# Patient Record
Sex: Male | Born: 1995 | Race: White | Hispanic: Yes | Marital: Single | Smoking: Never smoker
Health system: Southern US, Community
[De-identification: ages and names within clinical notes are randomized; demographics above are authoritative.]

## PROBLEM LIST (undated history)

## (undated) DIAGNOSIS — R7303 Prediabetes: Secondary | ICD-10-CM

## (undated) DIAGNOSIS — S82209A Unspecified fracture of shaft of unspecified tibia, initial encounter for closed fracture: Secondary | ICD-10-CM

## (undated) DIAGNOSIS — E669 Obesity, unspecified: Secondary | ICD-10-CM

## (undated) DIAGNOSIS — Z8719 Personal history of other diseases of the digestive system: Secondary | ICD-10-CM

## (undated) DIAGNOSIS — N62 Hypertrophy of breast: Secondary | ICD-10-CM

## (undated) DIAGNOSIS — L309 Dermatitis, unspecified: Secondary | ICD-10-CM

## (undated) DIAGNOSIS — T4145XA Adverse effect of unspecified anesthetic, initial encounter: Secondary | ICD-10-CM

## (undated) DIAGNOSIS — S82409A Unspecified fracture of shaft of unspecified fibula, initial encounter for closed fracture: Secondary | ICD-10-CM

## (undated) DIAGNOSIS — N442 Benign cyst of testis: Secondary | ICD-10-CM

## (undated) DIAGNOSIS — E049 Nontoxic goiter, unspecified: Secondary | ICD-10-CM

## (undated) DIAGNOSIS — E063 Autoimmune thyroiditis: Secondary | ICD-10-CM

## (undated) DIAGNOSIS — T8859XA Other complications of anesthesia, initial encounter: Secondary | ICD-10-CM

## (undated) DIAGNOSIS — I10 Essential (primary) hypertension: Secondary | ICD-10-CM

## (undated) DIAGNOSIS — K219 Gastro-esophageal reflux disease without esophagitis: Secondary | ICD-10-CM

## (undated) DIAGNOSIS — R1013 Epigastric pain: Secondary | ICD-10-CM

## (undated) HISTORY — PX: OTHER SURGICAL HISTORY: SHX169

## (undated) HISTORY — DX: Gastro-esophageal reflux disease without esophagitis: K21.9

## (undated) HISTORY — DX: Epigastric pain: R10.13

## (undated) HISTORY — DX: Hypertrophy of breast: N62

## (undated) HISTORY — DX: Nontoxic goiter, unspecified: E04.9

## (undated) HISTORY — DX: Prediabetes: R73.03

## (undated) HISTORY — DX: Autoimmune thyroiditis: E06.3

## (undated) HISTORY — PX: INGUINAL HERNIA REPAIR: SUR1180

## (undated) HISTORY — DX: Essential (primary) hypertension: I10

## (undated) HISTORY — DX: Obesity, unspecified: E66.9

---
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
## 2016-01-01 ENCOUNTER — Other Ambulatory Visit: Payer: Self-pay | Admitting: Pediatric Endocrinology

## 2016-02-08 ENCOUNTER — Ambulatory Visit (INDEPENDENT_AMBULATORY_CARE_PROVIDER_SITE_OTHER): Payer: Self-pay | Admitting: Pediatric Endocrinology

## 2016-03-31 ENCOUNTER — Ambulatory Visit (INDEPENDENT_AMBULATORY_CARE_PROVIDER_SITE_OTHER): Payer: Medicaid Other | Admitting: Pediatric Endocrinology

## 2016-03-31 ENCOUNTER — Other Ambulatory Visit: Payer: Self-pay | Admitting: Pediatric Endocrinology

## 2016-03-31 DIAGNOSIS — I10 Essential (primary) hypertension: Secondary | ICD-10-CM

## 2016-04-07 ENCOUNTER — Encounter (HOSPITAL_COMMUNITY): Payer: Self-pay | Admitting: Emergency Medicine

## 2016-04-07 ENCOUNTER — Emergency Department (HOSPITAL_COMMUNITY)
Admission: EM | Admit: 2016-04-07 | Discharge: 2016-04-07 | Disposition: A | Discharge status: Home / Self Care | Payer: Medicaid Other | Attending: Dermatology | Admitting: Dermatology

## 2016-04-07 DIAGNOSIS — Z5321 Procedure and treatment not carried out due to patient leaving prior to being seen by health care provider: Secondary | ICD-10-CM | POA: Insufficient documentation

## 2016-04-07 DIAGNOSIS — M79644 Pain in right finger(s): Secondary | ICD-10-CM | POA: Insufficient documentation

## 2016-04-07 NOTE — ED Triage Notes (Signed)
Pt st's 2 days ago he almost fell and put his hand out to catch himself.  Pt c/o pain to right 5th finger.

## 2016-04-07 NOTE — ED Triage Notes (Signed)
Patient states he wants to leave and go to his doctor instead. Pt encouraged to stay as many are not in front of him, but he wants to leave. Ambulatory with no signs of distress.

## 2016-04-08 ENCOUNTER — Encounter (HOSPITAL_COMMUNITY): Payer: Self-pay | Admitting: Emergency Medicine

## 2016-04-08 ENCOUNTER — Emergency Department (HOSPITAL_COMMUNITY): Payer: Medicaid Other

## 2016-04-08 ENCOUNTER — Emergency Department (HOSPITAL_COMMUNITY)
Admission: EM | Admit: 2016-04-08 | Discharge: 2016-04-08 | Disposition: A | Discharge status: Home / Self Care | Payer: Medicaid Other | Attending: Emergency Medicine | Admitting: Emergency Medicine

## 2016-04-08 DIAGNOSIS — E039 Hypothyroidism, unspecified: Secondary | ICD-10-CM | POA: Insufficient documentation

## 2016-04-08 DIAGNOSIS — S6991XA Unspecified injury of right wrist, hand and finger(s), initial encounter: Secondary | ICD-10-CM | POA: Insufficient documentation

## 2016-04-08 DIAGNOSIS — Y999 Unspecified external cause status: Secondary | ICD-10-CM | POA: Insufficient documentation

## 2016-04-08 DIAGNOSIS — W1839XA Other fall on same level, initial encounter: Secondary | ICD-10-CM | POA: Insufficient documentation

## 2016-04-08 DIAGNOSIS — Z79899 Other long term (current) drug therapy: Secondary | ICD-10-CM | POA: Insufficient documentation

## 2016-04-08 DIAGNOSIS — Y9389 Activity, other specified: Secondary | ICD-10-CM | POA: Insufficient documentation

## 2016-04-08 DIAGNOSIS — Y9289 Other specified places as the place of occurrence of the external cause: Secondary | ICD-10-CM | POA: Insufficient documentation

## 2016-04-08 DIAGNOSIS — I10 Essential (primary) hypertension: Secondary | ICD-10-CM | POA: Insufficient documentation

## 2016-04-08 NOTE — ED Provider Notes (Signed)
[facility]-EMERGENCY DEPT Provider Note   CSN: [id number] Arrival date & time: 04/08/16  4098  By signing my name below, I, Edwin Lee, attest that this documentation has been prepared under the direction and in the presence of  Edwin Robinsons, PA-C. Electronically Signed: Doreatha Lee, ED Scribe. 04/08/16. 10:36 AM.    History   Chief Complaint Chief Complaint  Patient presents with  . Hand Pain  . Finger Injury    HPI Edwin Lee is a 21 y.o. male who presents to the Emergency Department complaining of intermittent right 3rd, 4th and 5th finger tingling s/p injury that occurred 3 days ago with associated mild right 5th finger pain, resolved swelling. Pt reports he fell to the right and caught himself with his right hand on a wall. He denies LOC or head injury. He states his tingling concerns him more than any pain. Per pt, he notices his tingling is more prevalent at night. Pt denies taking OTC medications at home to improve symptoms. Pt states his pain is worsened with finger extension and there are no alleviating factors noted. He denies additional injuries.   The history is provided by the patient. No language interpreter was used.  Hand Pain  This is a new problem. The current episode started more than 2 days ago. The problem occurs daily. The problem has been rapidly improving. The symptoms are aggravated by bending. Nothing relieves the symptoms. He has tried nothing for the symptoms. The treatment provided no relief.    Past Medical History:  Diagnosis Date  . Dyspepsia   . GERD (gastroesophageal reflux disease)   . Goiter   . Gynecomastia, male   . Hypertension   . Hypothyroidism, acquired, autoimmune   . Obesity   . Pre-diabetes   . Thyroiditis, autoimmune     Patient Active Problem List   Diagnosis Date Noted  . Left varicocele 07/25/2014  . Eczema 09/13/2013  . Severe obesity (BMI >= 40) (HCC) 06/07/2013  . Hypertriglyceridemia 12/03/2012  . Hypothyroidism,  acquired, autoimmune   . Thyroiditis, autoimmune   . Goiter   . Gynecomastia, male   . Dyspepsia   . GERD (gastroesophageal reflux disease)   . Hypertension   . Pre-diabetes 08/09/2010    Past Surgical History:  Procedure Laterality Date  . INGUINAL HERNIA REPAIR    . left foot         Home Medications    Prior to Admission medications   Medication Sig Start Date End Date Taking? Authorizing Provider  albuterol (PROVENTIL HFA;VENTOLIN HFA) 108 (90 BASE) MCG/ACT inhaler Inhale 2 puffs into the lungs every 6 (six) hours as needed for wheezing or shortness of breath. Patient not taking: Reported on 08/06/2015 07/24/14   Voncille Lo, MD  clindamycin-benzoyl peroxide [facility]) gel Apply topically 2 (two) times daily. Patient not taking: Reported on 08/06/2015 02/20/15   Voncille Lo, MD  levothyroxine (SYNTHROID, LEVOTHROID) 112 MCG tablet TAKE 1 TABLET BY MOUTH DAILY 01/01/16   Dessa Phi, MD  lisinopril-hydrochlorothiazide (PRINZIDE,ZESTORETIC) 20-25 MG tablet TAKE 1 TABLET BY MOUTH DAILY 04/01/16   David Stall, MD  metFORMIN (GLUCOPHAGE) 500 MG tablet TAKE 1 TABLET BY MOUTH TWICE DAILY WITH MEALS 10/08/15   Verneda Skill, FNP  Omega-3 Fatty Acids (FISH OIL) 1000 MG CAPS Take 1 capsule (1,000 mg total) by mouth daily. Patient not taking: Reported on 08/06/2015 12/03/12   Dessa Phi, MD  omeprazole (PRILOSEC) 40 MG capsule TAKE 1 CAPSULE BY MOUTH EVERY DAY 10/08/15   Rayfield Citizen  Coralee North, FNP  triamcinolone ointment (KENALOG) 0.1 % Apply 1 application topically 2 (two) times daily. To rough raised patches of eczema. Patient not taking: Reported on 12/29/2014 09/13/13   Voncille Lo, MD    Family History Family History  Problem Relation Age of Onset  . Hypertension Father   . Thyroid disease Maternal Uncle     one maternal uncle has high thryoid blood tests. The other uncle has low blood tests.  . Hypertension Paternal Uncle   . Diabetes Maternal Grandmother   .  Hypertension Paternal Grandmother   . Obesity Neg Hx   . Kidney disease Neg Hx   . Cancer Neg Hx   . Anemia Neg Hx     Social History Social History  Substance Use Topics  . Smoking status: Never Smoker  . Smokeless tobacco: Never Used  . Alcohol use No     Allergies   Patient has no known allergies.   Review of Systems Review of Systems  Musculoskeletal: Positive for arthralgias (right 5th finger) and joint swelling (right 5th finger).  Neurological:       +tinging right 3rd, 4th, 5th finger     Physical Exam Updated Vital Signs BP 164/86 (BP Location: Right Arm)   Pulse 60   Temp 98 F (36.7 C) (Oral)   Resp 18   SpO2 98%   Physical Exam  Constitutional: He appears well-developed and well-nourished. No distress.  Patient is afebrile, non-toxic appearing, seating comfortably in chair in no acute distress.   HENT:  Head: Normocephalic.  Eyes: Conjunctivae and EOM are normal.  Neck: Normal range of motion.  Cardiovascular: Normal rate, regular rhythm, normal heart sounds and intact distal pulses.   Pulmonary/Chest: Effort normal and breath sounds normal. No respiratory distress. He has no wheezes. He has no rales.  Lungs clear and equal   Abdominal: He exhibits no distension.  Musculoskeletal: Normal range of motion. He exhibits tenderness.  NVI bilaterally to the upper extremities. FROM of all fingers. Strong grip. 5/5 strength to finger spread against resistance. Some tenderness to the metacarpal bone of the 5th digit.   Neurological: He is alert. No sensory deficit. He exhibits normal muscle tone.  Skin: Skin is warm and dry. Capillary refill takes less than 2 seconds. No rash noted. No erythema. No pallor.  Psychiatric: He has a normal mood and affect. His behavior is normal.  Nursing note and vitals reviewed.    ED Treatments / Results   DIAGNOSTIC STUDIES: Oxygen Saturation is 98% on RA, normal by my interpretation.    COORDINATION OF CARE: 10:26 AM  Discussed treatment plan with pt at bedside which includes XR and pt agreed to plan.    Labs (all labs ordered are listed, but only abnormal results are displayed) Labs Reviewed - No data to display  EKG  EKG Interpretation None       Radiology Dg Hand Complete Right  Result Date: 04/08/2016 CLINICAL DATA:  Pt fell against wall 3 days ago and tried to catch self with rt hand now has tingling, numbness rt little finger and 5th metacarpal EXAM: RIGHT HAND - COMPLETE 3+ VIEW COMPARISON:  None. FINDINGS: There is no evidence of fracture or dislocation. There is no evidence of arthropathy or other focal bone abnormality. Soft tissues are unremarkable. IMPRESSION: No acute osseous injury of the right hand. Electronically Signed   By: Elige Ko   On: 04/08/2016 11:11    Procedures Procedures (including critical care time)  Medications  Ordered in ED Medications - No data to display   Initial Impression / Assessment and Plan / ED Course  I have reviewed the triage vital signs and the nursing notes.  Pertinent labs & imaging results that were available during my care of the patient were reviewed by me and considered in my medical decision making (see chart for details).     21 y/o presenting with hand injury after catching himself on a wall with his right hand. He denied any pain and reports numbness especially at night or while taking a shower in the median nerve distribution including his 5th, 4th and half of the 3rd finger. He was not having symptoms at this time and the swelling has resolved. He did report mild tenderness to palpation of the 5th metacarpal.  Patient X-Ray negative for obvious fracture or dislocation.   Pt advised to follow up with hand surgery. Patient offered brace while in ED, but he has a wrist splint at home and wants to use it. I had the nurse ace bandage the wrist for increased stability. Conservative therapy recommended and discussed.  Patient will be  discharged home & is agreeable with above plan. Pt appears safe for discharge.   Discussed strict return precautions. Patient was advised to return to the emergency department if experiencing any new or worsening symptoms. Patient clearly understood instructions and agreed with discharge plan.   Final Clinical Impressions(s) / ED Diagnoses   Final diagnoses:  Hand injury, right, initial encounter    New Prescriptions New Prescriptions   No medications on file    I personally performed the services described in this documentation, which was scribed in my presence. The recorded information has been reviewed and is accurate.    Georgiana ShoreJessica B Garrette Caine, PA-C 04/08/16 1127    Tilden FossaElizabeth Rees, MD 04/08/16 [id number] [id number]

## 2016-04-08 NOTE — ED Notes (Signed)
Pt walked out of ED, told ortho tech he did not want to wait for paperwork

## 2016-04-08 NOTE — ED Notes (Signed)
Bed: WTR5 Expected date:  Expected time:  Means of arrival:  Comments: 

## 2016-04-08 NOTE — ED Notes (Signed)
Bed: WTR7 Expected date:  Expected time:  Means of arrival:  Comments: 

## 2016-04-30 ENCOUNTER — Other Ambulatory Visit: Payer: Self-pay | Admitting: "Endocrinology

## 2016-04-30 DIAGNOSIS — I10 Essential (primary) hypertension: Secondary | ICD-10-CM

## 2016-05-02 ENCOUNTER — Other Ambulatory Visit: Payer: Self-pay | Admitting: Pediatric Endocrinology

## 2016-05-16 ENCOUNTER — Ambulatory Visit (INDEPENDENT_AMBULATORY_CARE_PROVIDER_SITE_OTHER): Payer: Medicaid Other | Admitting: Pediatric Endocrinology

## 2016-05-22 ENCOUNTER — Other Ambulatory Visit: Payer: Self-pay | Admitting: Pediatrics

## 2016-05-23 NOTE — Telephone Encounter (Signed)
Endo

## 2016-06-01 ENCOUNTER — Other Ambulatory Visit: Payer: Self-pay | Admitting: Pediatric Endocrinology

## 2016-06-01 DIAGNOSIS — I10 Essential (primary) hypertension: Secondary | ICD-10-CM

## 2016-07-31 ENCOUNTER — Other Ambulatory Visit: Payer: Self-pay | Admitting: Pediatric Endocrinology

## 2017-08-26 DIAGNOSIS — K4090 Unilateral inguinal hernia, without obstruction or gangrene, not specified as recurrent: Secondary | ICD-10-CM | POA: Diagnosis not present

## 2017-08-26 DIAGNOSIS — I10 Essential (primary) hypertension: Secondary | ICD-10-CM | POA: Insufficient documentation

## 2017-08-26 DIAGNOSIS — R350 Frequency of micturition: Secondary | ICD-10-CM | POA: Insufficient documentation

## 2017-08-26 DIAGNOSIS — N50812 Left testicular pain: Secondary | ICD-10-CM | POA: Diagnosis present

## 2017-08-26 DIAGNOSIS — E039 Hypothyroidism, unspecified: Secondary | ICD-10-CM | POA: Insufficient documentation

## 2017-08-27 ENCOUNTER — Encounter (HOSPITAL_COMMUNITY): Payer: Self-pay | Admitting: *Deleted

## 2017-08-27 ENCOUNTER — Other Ambulatory Visit: Payer: Self-pay

## 2017-08-27 ENCOUNTER — Emergency Department (HOSPITAL_COMMUNITY): Payer: Medicaid Other

## 2017-08-27 ENCOUNTER — Emergency Department (HOSPITAL_COMMUNITY)
Admission: EM | Admit: 2017-08-27 | Discharge: 2017-08-27 | Disposition: A | Discharge status: Home / Self Care | Payer: Medicaid Other | Attending: Emergency Medicine | Admitting: Emergency Medicine

## 2017-08-27 DIAGNOSIS — N442 Benign cyst of testis: Secondary | ICD-10-CM

## 2017-08-27 DIAGNOSIS — I10 Essential (primary) hypertension: Secondary | ICD-10-CM

## 2017-08-27 DIAGNOSIS — K4090 Unilateral inguinal hernia, without obstruction or gangrene, not specified as recurrent: Secondary | ICD-10-CM

## 2017-08-27 DIAGNOSIS — N50812 Left testicular pain: Secondary | ICD-10-CM

## 2017-08-27 HISTORY — DX: Benign cyst of testis: N44.2

## 2017-08-27 LAB — COMPREHENSIVE METABOLIC PANEL
ALT: 30 U/L (ref 0–44)
ANION GAP: 9 (ref 5–15)
AST: 26 U/L (ref 15–41)
Albumin: 3.8 g/dL (ref 3.5–5.0)
Alkaline Phosphatase: 57 U/L (ref 38–126)
BUN: 11 mg/dL (ref 6–20)
CHLORIDE: 103 mmol/L (ref 98–111)
CO2: 26 mmol/L (ref 22–32)
Calcium: 9.3 mg/dL (ref 8.9–10.3)
Creatinine, Ser: 0.9 mg/dL (ref 0.61–1.24)
GFR calc Af Amer: >60 mL/min (ref >60)
GFR calc non Af Amer: >60 mL/min (ref >60)
GLUCOSE: 108 mg/dL — AB (ref 70–99)
POTASSIUM: 4.3 mmol/L (ref 3.5–5.1)
Sodium: 138 mmol/L (ref 135–145)
TOTAL PROTEIN: 7.5 g/dL (ref 6.5–8.1)
Total Bilirubin: 0.4 mg/dL (ref 0.3–1.2)

## 2017-08-27 LAB — CBC WITH DIFFERENTIAL/PLATELET
BASOS ABS: 0 10*3/uL (ref 0.0–0.1)
BASOS PCT: 0 %
EOS ABS: 0.3 10*3/uL (ref 0.0–0.7)
EOS PCT: 3 %
HCT: 47.6 % (ref 39.0–52.0)
Hemoglobin: 16.3 g/dL (ref 13.0–17.0)
LYMPHS ABS: 2.3 10*3/uL (ref 0.7–4.0)
LYMPHS PCT: 24 %
MCH: 32.6 pg (ref 26.0–34.0)
MCHC: 34.2 g/dL (ref 30.0–36.0)
MCV: 95.2 fL (ref 78.0–100.0)
MONO ABS: 0.6 10*3/uL (ref 0.1–1.0)
Monocytes Relative: 6 %
Neutro Abs: 6.3 10*3/uL (ref 1.7–7.7)
Neutrophils Relative %: 67 %
PLATELETS: 254 10*3/uL (ref 150–400)
RBC: 5 MIL/uL (ref 4.22–5.81)
RDW: 12.7 % (ref 11.5–15.5)
WBC: 9.5 10*3/uL (ref 4.0–10.5)

## 2017-08-27 LAB — URINALYSIS, ROUTINE W REFLEX MICROSCOPIC
Bilirubin Urine: NEGATIVE
Glucose, UA: NEGATIVE mg/dL
HGB URINE DIPSTICK: NEGATIVE
Ketones, ur: NEGATIVE mg/dL
LEUKOCYTES UA: NEGATIVE
Nitrite: NEGATIVE
Protein, ur: NEGATIVE mg/dL
SPECIFIC GRAVITY, URINE: 1.026 (ref 1.005–1.030)
pH: 5 (ref 5.0–8.0)

## 2017-08-27 NOTE — ED Notes (Signed)
Bed: WTR8 Expected date:  Expected time:  Means of arrival:  Comments: 

## 2017-08-27 NOTE — ED Provider Notes (Signed)
[facility]-EMERGENCY DEPT Provider Note   CSN: [id number] Arrival date & time: 08/26/17  2332     History   Chief Complaint Chief Complaint  Patient presents with  . Testicle Pain    left    HPI Edwin Lee is a 22 y.o. male with a hx of dyspepsia, GERD, HTN, hypothyroid, prediabetes presents to the Emergency Department complaining of gradual, persistent, progressively worsening, waxing and waning left testicular pain onset 2 days ago. Associated symptoms include LLQ abd pain.  Walking makes his symptoms worse.  Laying makes the symptoms better.  Pt denies fever, chills, headache, neck pain, chest pain, N/V, weakness, dizziness, syncope.  Pt reports several loose stools over the last few days.  He denies dysuria, hematuria, urgency.  Pt does have increased frequency.  Pt denies penile discharge.   Pt reports inguinal hernia repair at 22mo of age without complication. No other abd surgeries.     The history is provided by the patient and medical records. No language interpreter was used.    Past Medical History:  Diagnosis Date  . Dyspepsia   . GERD (gastroesophageal reflux disease)   . Goiter   . Gynecomastia, male   . Hypertension   . Hypothyroidism, acquired, autoimmune   . Obesity   . Pre-diabetes   . Thyroiditis, autoimmune     Patient Active Problem List   Diagnosis Date Noted  . Left varicocele 07/25/2014  . Eczema 09/13/2013  . Severe obesity (BMI >= 40) (HCC) 06/07/2013  . Hypertriglyceridemia 12/03/2012  . Hypothyroidism, acquired, autoimmune   . Thyroiditis, autoimmune   . Goiter   . Gynecomastia, male   . Dyspepsia   . GERD (gastroesophageal reflux disease)   . Hypertension   . Pre-diabetes 08/09/2010    Past Surgical History:  Procedure Laterality Date  . INGUINAL HERNIA REPAIR    . left foot          Home Medications    Prior to Admission medications   Medication Sig Start Date End Date Taking? Authorizing Provider    albuterol (PROVENTIL HFA;VENTOLIN HFA) 108 (90 BASE) MCG/ACT inhaler Inhale 2 puffs into the lungs every 6 (six) hours as needed for wheezing or shortness of breath. Patient not taking: Reported on 08/06/2015 07/24/14   Ettefagh, Aron BabaKate Scott, MD    Family History Family History  Problem Relation Age of Onset  . Hypertension Father   . Thyroid disease Maternal Uncle        one maternal uncle has high thryoid blood tests. The other uncle has low blood tests.  . Hypertension Paternal Uncle   . Diabetes Maternal Grandmother   . Hypertension Paternal Grandmother   . Obesity Neg Hx   . Kidney disease Neg Hx   . Cancer Neg Hx   . Anemia Neg Hx     Social History Social History   Tobacco Use  . Smoking status: Never Smoker  . Smokeless tobacco: Never Used  Substance Use Topics  . Alcohol use: No  . Drug use: No     Allergies   Patient has no known allergies.   Review of Systems Review of Systems  Constitutional: Negative for appetite change, diaphoresis, fatigue, fever and unexpected weight change.  HENT: Negative for mouth sores.   Eyes: Negative for visual disturbance.  Respiratory: Negative for cough, chest tightness, shortness of breath and wheezing.   Cardiovascular: Negative for chest pain.  Gastrointestinal: Negative for abdominal pain, constipation, diarrhea, nausea and vomiting.  Endocrine:  Negative for polydipsia, polyphagia and polyuria.  Genitourinary: Positive for frequency, scrotal swelling and testicular pain. Negative for dysuria, hematuria and urgency.  Musculoskeletal: Negative for back pain and neck stiffness.  Skin: Negative for rash.  Allergic/Immunologic: Negative for immunocompromised state.  Neurological: Negative for syncope, light-headedness and headaches.  Hematological: Does not bruise/bleed easily.  Psychiatric/Behavioral: Negative for sleep disturbance. The patient is not nervous/anxious.      Physical Exam Updated Vital Signs BP (!) 160/79  (BP Location: Right Arm)   Pulse 66   Temp 98 F (36.7 C) (Oral)   Resp 18   Ht 6' (1.829 m)   Wt 113.4 kg (250 lb)   SpO2 97%   BMI 33.91 kg/m   Physical Exam  Constitutional: He appears well-developed and well-nourished. No distress.  Awake, alert, nontoxic appearance  HENT:  Head: Normocephalic and atraumatic.  Mouth/Throat: Oropharynx is clear and moist. No oropharyngeal exudate.  Eyes: Conjunctivae are normal. No scleral icterus.  Neck: Normal range of motion. Neck supple.  Cardiovascular: Normal rate, regular rhythm and intact distal pulses.  Pulmonary/Chest: Effort normal and breath sounds normal. No respiratory distress. He has no wheezes.  Equal chest expansion  Abdominal: Soft. Bowel sounds are normal. He exhibits no mass. There is no tenderness. There is no rebound and no guarding. A hernia is present. Hernia confirmed positive in the left inguinal area. Hernia confirmed negative in the right inguinal area.  Genitourinary: Right testis shows no mass, no swelling and no tenderness. Right testis is descended. Left testis shows tenderness. Left testis shows no mass and no swelling. Left testis is descended. Uncircumcised. No phimosis, paraphimosis, hypospadias, penile erythema or penile tenderness. No discharge found.     Musculoskeletal: Normal range of motion. He exhibits no edema.  Lymphadenopathy: No inguinal adenopathy noted on the right or left side.  Neurological: He is alert.  Speech is clear and goal oriented Moves extremities without ataxia  Skin: Skin is warm and dry. He is not diaphoretic.  Psychiatric: He has a normal mood and affect.  Nursing note and vitals reviewed.    ED Treatments / Results  Labs (all labs ordered are listed, but only abnormal results are displayed) Labs Reviewed  COMPREHENSIVE METABOLIC PANEL - Abnormal; Notable for the following components:      Result Value   Glucose, Bld 108 (*)    All other components within normal limits    CBC WITH DIFFERENTIAL/PLATELET  URINALYSIS, ROUTINE W REFLEX MICROSCOPIC     Radiology US Scrotum W/doppler  Result Date: 08/27/2017 CLINICAL DATA:  Left testicular pain for 3 days. EXAM: SCROTAL ULTRASOUND DOPPLER ULTRASOUND OF THE TESTICLES TECHNIQUE: Complete ultrasound examination of the testicles, epididymis, and other scrotal structures was performed. Color and spectral Doppler ultrasound were also utilized to evaluate blood flow to the testicles. COMPARISON:  None. FINDINGS: Right testicle Measurements: 3.8 x 2.2 x 3.2 cm. No mass or microlithiasis visualized. There is a small simple appearing cyst measuring about 6 mm maximal diameter either in the periphery or adjacent to the superior right testis. Left testicle Measurements: 3.7 x 2.6 x 3.2 cm. No mass or microlithiasis visualized. Right epididymis:  Normal in size and appearance. Left epididymis:  Normal in size and appearance. Hydrocele:  None visualized. Varicocele:  None visualized. Pulsed Doppler interrogation of both testes demonstrates normal low resistance arterial and venous waveforms bilaterally. IMPRESSION: 6 mm peritesticular cyst on the right is likely benign. Left testis is normal. No evidence of solid mass, torsion, or inflammatory  change. Electronically Signed   By: Burman Nieves M.D.   On: 08/27/2017 03:10    Procedures Procedures (including critical care time)  Medications Ordered in ED Medications - No data to display   Initial Impression / Assessment and Plan / ED Course  I have reviewed the triage vital signs and the nursing notes.  Pertinent labs & imaging results that were available during my care of the patient were reviewed by me and considered in my medical decision making (see chart for details).  Clinical Course as of Aug 28 622  Wynelle Link Aug 27, 2017  0403 Patient hypertensive.  He reports a history of same but is not taking any medications.  BP(!): 160/79 [HM]  0403 No leukocytosis.  WBC: 9.5 [HM]     Clinical Course User Index [HM] Murline Weigel, Dahlia Client, PA-C    Pt presents with left testicular pain.  Patient denies dysuria, hematuria or penile discharge.  He denies risk factors for STDs.  Patient is without leukocytosis.  Urinalysis without evidence of urinary tract infection.  Ultrasound shows no evidence of testicular torsion.  On exam, patient has reducible indirect inguinal hernia.  His exam is difficult however with increased intra-abdominal pressure the hernia is palpable but easily reduces.  Patient is pain-free on my exam.  No evidence of overlying cellulitis.  Strict return precautions given.  Patient will need close surgery follow-up.  Patient noted to be hypertensive in the emergency department.  No signs of hypertensive urgency.  Discussed with patient the need for close follow-up and management by their primary care physician.   The patient was discussed with and seen by Dr. Adela Lank who agrees with the treatment plan.    Final Clinical Impressions(s) / ED Diagnoses   Final diagnoses:  Left testicular pain  Unilateral inguinal hernia without obstruction or gangrene, recurrence not specified  Essential hypertension    ED Discharge Orders    None       Kerith Sherley, Boyd Kerbs 08/27/17 0624    Melene Plan, DO 08/27/17 [phone]

## 2017-08-27 NOTE — Discharge Instructions (Addendum)
1. Medications: usual home medications 2. Treatment: rest, drink plenty of fluids,  3. Follow Up: Please followup with Dr. Daphine DeutscherMartin to discuss repair of your hernia.  Please return to the ER for worsening pain, fever, chills, vomiting or other concerns

## 2017-08-27 NOTE — ED Triage Notes (Signed)
Pt c/o left testicular pain x 2 days.

## 2017-09-05 ENCOUNTER — Ambulatory Visit: Payer: Self-pay | Admitting: Surgery

## 2017-09-05 NOTE — H&P (Signed)
Edwin NeriAbraham Breach Documented: 09/05/2017 11:13 AM Location: [facility] Patient #: [id number] DOB: 18-Feb-1996 Single / Language: [place] / Race: Native [place] or Other [place] Islander Male  History of Present Illness Edwin Lee(Krishauna Schatzman C. Glinda Natzke MD; 09/05/2017 11:40 AM) The patient is a 22 year old male who presents with an inguinal hernia. Note for "Inguinal hernia": ` ` ` Patient sent for surgical consultation at the request of Dr Chapman FitchFloyd, [place] emergency department  Chief Complaint: Left testicular/inguinal pain with inguinal hernia ` ` The patient is a pleasant young man who noticed sharp groin pain and swelling on his left side last month. Felt to not. Concerned him. [facility]. Left angle hernia suspected. Had some testicular discomfort. Ultrasound done otherwise underwhelming. He recalls having both inguinal hernias repaired when he was 916 months of age [facility] [place] in [facility]. No other abdominal surgeries. He is noting some right groin discomfort and some sensitivity at his belly button as well. Usually moves his bowels about twice a day. No urinary issues. He does not smoke. Not a diabetic. Otherwise quite physically active. No history of cardiac issues. Some hypertension.  (Review of systems as stated in this history (HPI) or in the review of systems. Otherwise all other 12 point ROS are negative) ` ` ` CLINICAL DATA: Left testicular pain for 3 days.  EXAM: SCROTAL ULTRASOUND  DOPPLER ULTRASOUND OF THE TESTICLES  TECHNIQUE: Complete ultrasound examination of the testicles, epididymis, and other scrotal structures was performed. Color and spectral Doppler ultrasound were also utilized to evaluate blood flow to the testicles.  COMPARISON: None.  FINDINGS: Right testicle  Measurements: 3.8 x 2.2 x 3.2 cm. No mass or microlithiasis visualized. There is a small simple appearing cyst measuring about 6 mm maximal diameter  either in the periphery or adjacent to the superior right testis.  Left testicle  Measurements: 3.7 x 2.6 x 3.2 cm. No mass or microlithiasis visualized.  Right epididymis: Normal in size and appearance.  Left epididymis: Normal in size and appearance.  Hydrocele: None visualized.  Varicocele: None visualized.  Pulsed Doppler interrogation of both testes demonstrates normal low resistance arterial and venous waveforms bilaterally.  IMPRESSION: 6 mm peritesticular cyst on the right is likely benign. Left testis is normal. No evidence of solid mass, torsion, or inflammatory change.   Electronically Signed By: Burman NievesWilliam Stevens M.D. On: 08/27/2017 03:10   Past Surgical History (Tanisha A. Manson PasseyBrown, RMA; 09/05/2017 11:19 AM) Foot Surgery Left.  Diagnostic Studies History (Tanisha A. Manson PasseyBrown, RMA; 09/05/2017 11:19 AM) Colonoscopy never  Allergies (Tanisha A. Manson PasseyBrown, RMA; 09/05/2017 11:20 AM) No Known Drug Allergies [09/05/2017]: Allergies Reconciled  Medication History (Tanisha A. Manson PasseyBrown, RMA; 09/05/2017 11:20 AM) No Current Medications Medications Reconciled  Social History (Tanisha A. Manson PasseyBrown, RMA; 09/05/2017 11:19 AM) Caffeine use Carbonated beverages. No alcohol use No drug use Tobacco use Former smoker.  Family History (Tanisha A. Manson PasseyBrown, RMA; 09/05/2017 11:19 AM) Diabetes Mellitus Father. Hypertension Father.  Other Problems (Tanisha A. Manson PasseyBrown, RMA; 09/05/2017 11:19 AM) Asthma High blood pressure Inguinal Hernia Thyroid Disease     Review of Systems (Tanisha A. Brown RMA; 09/05/2017 11:19 AM) General Present- Appetite Loss, Chills, Fatigue and Night Sweats. Not Present- Fever, Weight Gain and Weight Loss. Skin Present- Dryness and Rash. Not Present- Change in Wart/Mole, Hives, Jaundice, New Lesions, Non-Healing Wounds and Ulcer. HEENT Present- Nose Bleed, Ringing in the Ears, Seasonal Allergies, Sinus Pain, Sore Throat and Wears glasses/contact  lenses. Not Present- Earache, Hearing Loss, Hoarseness, Oral  Ulcers, Visual Disturbances and Yellow Eyes. Respiratory Present- Difficulty Breathing. Not Present- Bloody sputum, Chronic Cough, Snoring and Wheezing. Breast Not Present- Breast Mass, Breast Pain, Nipple Discharge and Skin Changes. Cardiovascular Present- Chest Pain, Difficulty Breathing Lying Down, Leg Cramps and Shortness of Breath. Not Present- Palpitations, Rapid Heart Rate and Swelling of Extremities. Gastrointestinal Present- Abdominal Pain, Bloating, Chronic diarrhea, Constipation, Gets full quickly at meals and Nausea. Not Present- Bloody Stool, Change in Bowel Habits, Difficulty Swallowing, Excessive gas, Hemorrhoids, Indigestion, Rectal Pain and Vomiting. Male Genitourinary Present- Change in Urinary Stream. Not Present- Blood in Urine, Frequency, Impotence, Nocturia, Painful Urination, Urgency and Urine Leakage. Musculoskeletal Present- Back Pain, Muscle Pain and Muscle Weakness. Not Present- Joint Pain, Joint Stiffness and Swelling of Extremities. Neurological Present- Decreased Memory, Headaches, Numbness, Tingling, Trouble walking and Weakness. Not Present- Fainting, Seizures and Tremor. Psychiatric Not Present- Anxiety, Bipolar, Change in Sleep Pattern, Depression, Fearful and Frequent crying. Endocrine Present- Hot flashes. Not Present- Cold Intolerance, Excessive Hunger, Hair Changes, Heat Intolerance and New Diabetes. Hematology Not Present- Blood Thinners, Easy Bruising, Excessive bleeding, Gland problems, HIV and Persistent Infections.  Vitals (Tanisha A. Brown RMA; 09/05/2017 11:20 AM) 09/05/2017 11:19 AM Weight: 260.8 lb Height: 72in Body Surface Area: 2.38 m Body Mass Index: 35.37 kg/m  Temp.: 97.68F  Pulse: 70 (Regular)  BP: 128/84 (Sitting, Left Arm, Standard)      Physical Exam Edwin Sportsman MD; 09/05/2017 11:41 AM)  General Mental Status-Alert. General Appearance-Not in acute  distress, Not Sickly. Orientation-Oriented X3. Hydration-Well hydrated. Voice-Normal.  Integumentary Global Assessment Upon inspection and palpation of skin surfaces of the - Axillae: non-tender, no inflammation or ulceration, no drainage. and Distribution of scalp and body hair is normal. General Characteristics Temperature - normal warmth is noted.  Head and Neck Head-normocephalic, atraumatic with no lesions or palpable masses. Face Global Assessment - atraumatic, no absence of expression. Neck Global Assessment - no abnormal movements, no bruit auscultated on the right, no bruit auscultated on the left, no decreased range of motion, non-tender. Trachea-midline. Thyroid Gland Characteristics - non-tender.  Eye Eyeball - Left-Extraocular movements intact, No Nystagmus. Eyeball - Right-Extraocular movements intact, No Nystagmus. Cornea - Left-No Hazy. Cornea - Right-No Hazy. Sclera/Conjunctiva - Left-No scleral icterus, No Discharge. Sclera/Conjunctiva - Right-No scleral icterus, No Discharge. Pupil - Left-Direct reaction to light normal. Pupil - Right-Direct reaction to light normal.  ENMT Ears Pinna - Left - no drainage observed, no generalized tenderness observed. Right - no drainage observed, no generalized tenderness observed. Nose and Sinuses External Inspection of the Nose - no destructive lesion observed. Inspection of the nares - Left - quiet respiration. Right - quiet respiration. Mouth and Throat Lips - Upper Lip - no fissures observed, no pallor noted. Lower Lip - no fissures observed, no pallor noted. Nasopharynx - no discharge present. Oral Cavity/Oropharynx - Tongue - no dryness observed. Oral Mucosa - no cyanosis observed. Hypopharynx - no evidence of airway distress observed.  Chest and Lung Exam Inspection Movements - Normal and Symmetrical. Accessory muscles - No use of accessory muscles in breathing. Palpation Palpation of the  chest reveals - Non-tender. Auscultation Breath sounds - Normal and Clear.  Cardiovascular Auscultation Rhythm - Regular. Murmurs & Other Heart Sounds - Auscultation of the heart reveals - No Murmurs and No Systolic Clicks.  Abdomen Inspection Inspection of the abdomen reveals - No Visible peristalsis and No Abnormal pulsations. Umbilicus - No Bleeding, No Urine drainage. Palpation/Percussion Palpation and Percussion of the abdomen reveal - Soft, Non  Tender, No Rebound tenderness, No Rigidity (guarding) and No Cutaneous hyperesthesia. Note: Abdomen soft. Nontender. Not distended. Obese but not distended. No diastases. Small but very sensitive umbilical hernia at the base of the stalk. No guarding.  Male Genitourinary Sexual Maturity Tanner 5 - Adult hair pattern and Adult penile size and shape. Note: Sharp sensitivity at left spermatic cord and external ring with impulse suspicious for inguinal hernia. Sensitivity in right groin as well but impulse not as definite. No testicular masses. Uncircumcised male.  Peripheral Vascular Upper Extremity Inspection - Left - No Cyanotic nailbeds, Not Ischemic. Right - No Cyanotic nailbeds, Not Ischemic.  Neurologic Neurologic evaluation reveals -normal attention span and ability to concentrate, able to name objects and repeat phrases. Appropriate fund of knowledge , normal sensation and normal coordination. Mental Status Affect - not angry, not paranoid. Cranial Nerves-Normal Bilaterally. Gait-Normal.  Neuropsychiatric Mental status exam performed with findings of-able to articulate well with normal speech/language, rate, volume and coherence, thought content normal with ability to perform basic computations and apply abstract reasoning and no evidence of hallucinations, delusions, obsessions or homicidal/suicidal ideation.  Musculoskeletal Global Assessment Spine, Ribs and Pelvis - no instability, subluxation or laxity. Right  Upper Extremity - no instability, subluxation or laxity.  Lymphatic Head & Neck  General Head & Neck Lymphatics: Bilateral - Description - No Localized lymphadenopathy. Axillary  General Axillary Region: Bilateral - Description - No Localized lymphadenopathy. Femoral & Inguinal  Generalized Femoral & Inguinal Lymphatics: Left - Description - No Localized lymphadenopathy. Right - Description - No Localized lymphadenopathy.    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 I think he would benefit from laparoscopic exploration and repair of hernias found. He notes similar pain starting on the right side. Perhaps his developing one on there as well. Examination challenged by his thicken skin and obesity. Testicular ultrasound otherwise underwhelming for any other etiologies.  He is interested in proceeding with surgery as soon as possible   GROIN PAIN, RIGHT (R10.31) Impression: Right groin pain suspicious for inguinal hernia there as well. Not as definite on exam challenged by his body size.   UMBILICAL HERNIA WITHOUT OBSTRUCTION AND WITHOUT GANGRENE (K42.9) Impression: Small but sensitive umbilical hernia. Would primarily repair at the time of surgery unless larger than suspected undue mesh repair. I suspect it is small enough to just do a primary repair.   PREOP - ING HERNIA - ENCOUNTER FOR PREOPERATIVE EXAMINATION FOR GENERAL SURGICAL PROCEDURE (Z01.818)  Current Plans You are being scheduled for surgery- Our schedulers will call you.  You should hear from our office's scheduling department within 5 working days about the location, date, and time of surgery. We try to make accommodations for patient's preferences in scheduling surgery, but sometimes the OR schedule or the surgeon's schedule prevents [place] from making those  accommodations.  If you have not heard from our office [phone]) in 5 working days, call the office and ask for your surgeon's nurse.  If you have other questions about your diagnosis, plan, or surgery, call the office and ask for your surgeon's nurse.  Written instructions provided The anatomy & physiology of the abdominal wall and pelvic floor was discussed. The pathophysiology of hernias in the inguinal and pelvic region was discussed. Natural history risks such as progressive enlargement, pain, incarceration, and strangulation was discussed. Contributors to complications such as smoking, obesity, diabetes, prior surgery,  etc were discussed.  I feel the risks of no intervention will lead to serious problems that outweigh the operative risks; therefore, I recommended surgery to reduce and repair the hernia. I explained laparoscopic techniques with possible need for an open approach. I noted usual use of mesh to patch and/or buttress hernia repair  Risks such as bleeding, infection, abscess, need for further treatment, heart attack, death, and other risks were discussed. I noted a good likelihood this will help address the problem. Goals of post-operative recovery were discussed as well. Possibility that this will not correct all symptoms was explained. I stressed the importance of low-impact activity, aggressive pain control, avoiding constipation, & not pushing through pain to minimize risk of post-operative chronic pain or injury. Possibility of reherniation was discussed. We will work to minimize complications.  An educational handout further explaining the pathology & treatment options was given as well. Questions were answered. The patient expresses understanding & wishes to proceed with surgery.  Pt Education - Pamphlet Given - Laparoscopic Hernia Repair: discussed with patient and provided information. Pt Education - [facility] Pain Control (Kirstin Kugler) Pt Education - [facility] Hernia  Post-Op HCI (Watson Robarge): discussed with patient and provided information. Pt Education - [facility] Mesh education: discussed with patient and provided information.  Edwin Sportsman, MD, FACS, MASCRS Gastrointestinal and Minimally Invasive Surgery    [address], [address] [place], [place] [address] [phone] Main / Paging [phone] Fax

## 2017-09-05 NOTE — H&P (View-Only) (Signed)
Edwin Lee Documented: 09/05/2017 11:13 AM Location: [facility] Patient #: [id number] DOB: 06/06/1995 Single / Language: English / Race: Native Hawaiian or Other Pacific Islander Male  History of Present Illness (Deiontae Rabel C. Cindy Brindisi MD; 09/05/2017 11:40 AM) The patient is a 21 year old male who presents with an inguinal hernia. Note for "Inguinal hernia": ` ` ` Patient sent for surgical consultation at the request of Dr Floyd,  emergency department  Chief Complaint: Left testicular/inguinal pain with inguinal hernia ` ` The patient is a pleasant young man who noticed sharp groin pain and swelling on his left side last month. Felt to not. Concerned him. [facility]. Left angle hernia suspected. Had some testicular discomfort. Ultrasound done otherwise underwhelming. He recalls having both inguinal hernias repaired when he was 6 months of age at [facility] in [place] in 1999. No other abdominal surgeries. He is noting some right groin discomfort and some sensitivity at his belly button as well. Usually moves his bowels about twice a day. No urinary issues. He does not smoke. Not a diabetic. Otherwise quite physically active. No history of cardiac issues. Some hypertension.  (Review of systems as stated in this history (HPI) or in the review of systems. Otherwise all other 12 point ROS are negative) ` ` ` CLINICAL DATA: Left testicular pain for 3 days.  EXAM: SCROTAL ULTRASOUND  DOPPLER ULTRASOUND OF THE TESTICLES  TECHNIQUE: Complete ultrasound examination of the testicles, epididymis, and other scrotal structures was performed. Color and spectral Doppler ultrasound were also utilized to evaluate blood flow to the testicles.  COMPARISON: None.  FINDINGS: Right testicle  Measurements: 3.8 x 2.2 x 3.2 cm. No mass or microlithiasis visualized. There is a small simple appearing cyst measuring about 6 mm maximal diameter  either in the periphery or adjacent to the superior right testis.  Left testicle  Measurements: 3.7 x 2.6 x 3.2 cm. No mass or microlithiasis visualized.  Right epididymis: Normal in size and appearance.  Left epididymis: Normal in size and appearance.  Hydrocele: None visualized.  Varicocele: None visualized.  Pulsed Doppler interrogation of both testes demonstrates normal low resistance arterial and venous waveforms bilaterally.  IMPRESSION: 6 mm peritesticular cyst on the right is likely benign. Left testis is normal. No evidence of solid mass, torsion, or inflammatory change.   Electronically Signed By: William Stevens M.D. On: 08/27/2017 03:10   Past Surgical History (Tanisha A. Brown, RMA; 09/05/2017 11:19 AM) Foot Surgery Left.  Diagnostic Studies History (Tanisha A. Brown, RMA; 09/05/2017 11:19 AM) Colonoscopy never  Allergies (Tanisha A. Brown, RMA; 09/05/2017 11:20 AM) No Known Drug Allergies [09/05/2017]: Allergies Reconciled  Medication History (Tanisha A. Brown, RMA; 09/05/2017 11:20 AM) No Current Medications Medications Reconciled  Social History (Tanisha A. Brown, RMA; 09/05/2017 11:19 AM) Caffeine use Carbonated beverages. No alcohol use No drug use Tobacco use Former smoker.  Family History (Tanisha A. Brown, RMA; 09/05/2017 11:19 AM) Diabetes Mellitus Father. Hypertension Father.  Other Problems (Tanisha A. Brown, RMA; 09/05/2017 11:19 AM) Asthma High blood pressure Inguinal Hernia Thyroid Disease     Review of Systems (Tanisha A. Brown RMA; 09/05/2017 11:19 AM) General Present- Appetite Loss, Chills, Fatigue and Night Sweats. Not Present- Fever, Weight Gain and Weight Loss. Skin Present- Dryness and Rash. Not Present- Change in Wart/Mole, Hives, Jaundice, New Lesions, Non-Healing Wounds and Ulcer. HEENT Present- Nose Bleed, Ringing in the Ears, Seasonal Allergies, Sinus Pain, Sore Throat and Wears glasses/contact  lenses. Not Present- Earache, Hearing Loss, Hoarseness, Oral   Ulcers, Visual Disturbances and Yellow Eyes. Respiratory Present- Difficulty Breathing. Not Present- Bloody sputum, Chronic Cough, Snoring and Wheezing. Breast Not Present- Breast Mass, Breast Pain, Nipple Discharge and Skin Changes. Cardiovascular Present- Chest Pain, Difficulty Breathing Lying Down, Leg Cramps and Shortness of Breath. Not Present- Palpitations, Rapid Heart Rate and Swelling of Extremities. Gastrointestinal Present- Abdominal Pain, Bloating, Chronic diarrhea, Constipation, Gets full quickly at meals and Nausea. Not Present- Bloody Stool, Change in Bowel Habits, Difficulty Swallowing, Excessive gas, Hemorrhoids, Indigestion, Rectal Pain and Vomiting. Male Genitourinary Present- Change in Urinary Stream. Not Present- Blood in Urine, Frequency, Impotence, Nocturia, Painful Urination, Urgency and Urine Leakage. Musculoskeletal Present- Back Pain, Muscle Pain and Muscle Weakness. Not Present- Joint Pain, Joint Stiffness and Swelling of Extremities. Neurological Present- Decreased Memory, Headaches, Numbness, Tingling, Trouble walking and Weakness. Not Present- Fainting, Seizures and Tremor. Psychiatric Not Present- Anxiety, Bipolar, Change in Sleep Pattern, Depression, Fearful and Frequent crying. Endocrine Present- Hot flashes. Not Present- Cold Intolerance, Excessive Hunger, Hair Changes, Heat Intolerance and New Diabetes. Hematology Not Present- Blood Thinners, Easy Bruising, Excessive bleeding, Gland problems, HIV and Persistent Infections.  Vitals (Tanisha A. Brown RMA; 09/05/2017 11:20 AM) 09/05/2017 11:19 AM Weight: 260.8 lb Height: 72in Body Surface Area: 2.38 m Body Mass Index: 35.37 kg/m  Temp.: 97.8F  Pulse: 70 (Regular)  BP: 128/84 (Sitting, Left Arm, Standard)      Physical Exam (Julitza Rickles C. Jilliane Kazanjian MD; 09/05/2017 11:41 AM)  General Mental Status-Alert. General Appearance-Not in acute  distress, Not Sickly. Orientation-Oriented X3. Hydration-Well hydrated. Voice-Normal.  Integumentary Global Assessment Upon inspection and palpation of skin surfaces of the - Axillae: non-tender, no inflammation or ulceration, no drainage. and Distribution of scalp and body hair is normal. General Characteristics Temperature - normal warmth is noted.  Head and Neck Head-normocephalic, atraumatic with no lesions or palpable masses. Face Global Assessment - atraumatic, no absence of expression. Neck Global Assessment - no abnormal movements, no bruit auscultated on the right, no bruit auscultated on the left, no decreased range of motion, non-tender. Trachea-midline. Thyroid Gland Characteristics - non-tender.  Eye Eyeball - Left-Extraocular movements intact, No Nystagmus. Eyeball - Right-Extraocular movements intact, No Nystagmus. Cornea - Left-No Hazy. Cornea - Right-No Hazy. Sclera/Conjunctiva - Left-No scleral icterus, No Discharge. Sclera/Conjunctiva - Right-No scleral icterus, No Discharge. Pupil - Left-Direct reaction to light normal. Pupil - Right-Direct reaction to light normal.  ENMT Ears Pinna - Left - no drainage observed, no generalized tenderness observed. Right - no drainage observed, no generalized tenderness observed. Nose and Sinuses External Inspection of the Nose - no destructive lesion observed. Inspection of the nares - Left - quiet respiration. Right - quiet respiration. Mouth and Throat Lips - Upper Lip - no fissures observed, no pallor noted. Lower Lip - no fissures observed, no pallor noted. Nasopharynx - no discharge present. Oral Cavity/Oropharynx - Tongue - no dryness observed. Oral Mucosa - no cyanosis observed. Hypopharynx - no evidence of airway distress observed.  Chest and Lung Exam Inspection Movements - Normal and Symmetrical. Accessory muscles - No use of accessory muscles in breathing. Palpation Palpation of the  chest reveals - Non-tender. Auscultation Breath sounds - Normal and Clear.  Cardiovascular Auscultation Rhythm - Regular. Murmurs & Other Heart Sounds - Auscultation of the heart reveals - No Murmurs and No Systolic Clicks.  Abdomen Inspection Inspection of the abdomen reveals - No Visible peristalsis and No Abnormal pulsations. Umbilicus - No Bleeding, No Urine drainage. Palpation/Percussion Palpation and Percussion of the abdomen reveal - Soft, Non   Tender, No Rebound tenderness, No Rigidity (guarding) and No Cutaneous hyperesthesia. Note: Abdomen soft. Nontender. Not distended. Obese but not distended. No diastases. Small but very sensitive umbilical hernia at the base of the stalk. No guarding.  Male Genitourinary Sexual Maturity Tanner 5 - Adult hair pattern and Adult penile size and shape. Note: Sharp sensitivity at left spermatic cord and external ring with impulse suspicious for inguinal hernia. Sensitivity in right groin as well but impulse not as definite. No testicular masses. Uncircumcised male.  Peripheral Vascular Upper Extremity Inspection - Left - No Cyanotic nailbeds, Not Ischemic. Right - No Cyanotic nailbeds, Not Ischemic.  Neurologic Neurologic evaluation reveals -normal attention span and ability to concentrate, able to name objects and repeat phrases. Appropriate fund of knowledge , normal sensation and normal coordination. Mental Status Affect - not angry, not paranoid. Cranial Nerves-Normal Bilaterally. Gait-Normal.  Neuropsychiatric Mental status exam performed with findings of-able to articulate well with normal speech/language, rate, volume and coherence, thought content normal with ability to perform basic computations and apply abstract reasoning and no evidence of hallucinations, delusions, obsessions or homicidal/suicidal ideation.  Musculoskeletal Global Assessment Spine, Ribs and Pelvis - no instability, subluxation or laxity. Right  Upper Extremity - no instability, subluxation or laxity.  Lymphatic Head & Neck  General Head & Neck Lymphatics: Bilateral - Description - No Localized lymphadenopathy. Axillary  General Axillary Region: Bilateral - Description - No Localized lymphadenopathy. Femoral & Inguinal  Generalized Femoral & Inguinal Lymphatics: Left - Description - No Localized lymphadenopathy. Right - Description - No Localized lymphadenopathy.    Assessment & Plan (Colby Catanese C. 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 I think he would benefit from laparoscopic exploration and repair of hernias found. He notes similar pain starting on the right side. Perhaps his developing one on there as well. Examination challenged by his thicken skin and obesity. Testicular ultrasound otherwise underwhelming for any other etiologies.  He is interested in proceeding with surgery as soon as possible   GROIN PAIN, RIGHT (R10.31) Impression: Right groin pain suspicious for inguinal hernia there as well. Not as definite on exam challenged by his body size.   UMBILICAL HERNIA WITHOUT OBSTRUCTION AND WITHOUT GANGRENE (K42.9) Impression: Small but sensitive umbilical hernia. Would primarily repair at the time of surgery unless larger than suspected undue mesh repair. I suspect it is small enough to just do a primary repair.   PREOP - ING HERNIA - ENCOUNTER FOR PREOPERATIVE EXAMINATION FOR GENERAL SURGICAL PROCEDURE (Z01.818)  Current Plans You are being scheduled for surgery- Our schedulers will call you.  You should hear from our office's scheduling department within 5 working days about the location, date, and time of surgery. We try to make accommodations for patient's preferences in scheduling surgery, but sometimes the OR schedule or the surgeon's schedule prevents us from making those  accommodations.  If you have not heard from our office ([phone]) in 5 working days, call the office and ask for your surgeon's nurse.  If you have other questions about your diagnosis, plan, or surgery, call the office and ask for your surgeon's nurse.  Written instructions provided The anatomy & physiology of the abdominal wall and pelvic floor was discussed. The pathophysiology of hernias in the inguinal and pelvic region was discussed. Natural history risks such as progressive enlargement, pain, incarceration, and strangulation was discussed. Contributors to complications such as smoking, obesity, diabetes, prior surgery,   etc were discussed.  I feel the risks of no intervention will lead to serious problems that outweigh the operative risks; therefore, I recommended surgery to reduce and repair the hernia. I explained laparoscopic techniques with possible need for an open approach. I noted usual use of mesh to patch and/or buttress hernia repair  Risks such as bleeding, infection, abscess, need for further treatment, heart attack, death, and other risks were discussed. I noted a good likelihood this will help address the problem. Goals of post-operative recovery were discussed as well. Possibility that this will not correct all symptoms was explained. I stressed the importance of low-impact activity, aggressive pain control, avoiding constipation, & not pushing through pain to minimize risk of post-operative chronic pain or injury. Possibility of reherniation was discussed. We will work to minimize complications.  An educational handout further explaining the pathology & treatment options was given as well. Questions were answered. The patient expresses understanding & wishes to proceed with surgery.  Pt Education - Pamphlet Given - Laparoscopic Hernia Repair: discussed with patient and provided information. Pt Education - [facility] Pain Control (Cheikh Bramble) Pt Education - [facility] Hernia  Post-Op HCI (Arin Peral): discussed with patient and provided information. Pt Education - [facility] Mesh education: discussed with patient and provided information.  Leshawn Straka C. Tripp Goins, MD, FACS, MASCRS Gastrointestinal and Minimally Invasive Surgery    [address], [address] [place], [place] [address] [phone] Main / Paging [phone] Fax   

## 2017-09-14 ENCOUNTER — Encounter (HOSPITAL_COMMUNITY): Payer: Self-pay

## 2017-09-14 NOTE — Patient Instructions (Signed)
Your procedure is scheduled on: Thursday, Aug. 8, 2019   Surgery Time:  12:00-1:30PM   [facility] Main  Entrance    Report to admitting at 10:00 AM   Call this number if you have problems the morning of surgery [phone]   Do not eat food or drink liquids :After Midnight.   Do NOT smoke after Midnight   Complete one Ensure drink the morning of surgery by 9:00AM the day of surgery.   Take these medicines the morning of surgery with A SIP OF WATER: None   DO NOT TAKE ANY DIABETIC MEDICATIONS DAY OF YOUR SURGERY                               You may not have any metal on your body including jewelry, and body piercings             Do not wear lotions, powders, perfumes/cologne, or deodorant                         Men may shave face and neck.   Do not bring valuables to the hospital. [place] [facility] IS NOT             RESPONSIBLE   FOR VALUABLES.   Contacts, dentures or bridgework may not be worn into surgery.    Patients discharged the day of surgery will not be allowed to drive home.   Special Instructions: Bring a copy of your healthcare power of attorney and living will documents         the day of surgery if you haven't scanned them in before.              Please read over the following fact sheets you were given:  [facility] - Preparing for [facility] Before surgery, you can play an important role.  Because skin is not sterile, your skin needs to be as free of germs as possible.  You can reduce the number of germs on your skin by washing with CHG (chlorahexidine gluconate) soap before surgery.  CHG is an antiseptic cleaner which kills germs and bonds with the skin to continue killing germs even after washing. Please DO NOT use if you have an allergy to CHG or antibacterial soaps.  If your skin becomes reddened/irritated stop using the CHG and inform your nurse when you arrive at Short Stay. Do not shave (including legs and underarms) for at least 48  hours prior to the first CHG shower.  You may shave your face/neck.  Please follow these instructions carefully:  1.  Shower with CHG Soap the night before surgery and the  morning of surgery.  2.  If you choose to wash your hair, wash your hair first as usual with your normal  shampoo.  3.  After you shampoo, rinse your hair and body thoroughly to remove the shampoo.                             4.  Use CHG as you would any other liquid soap.  You can apply chg directly to the skin and wash.  Gently with a scrungie or clean washcloth.  5.  Apply the CHG Soap to your body ONLY FROM THE NECK DOWN.   Do   not use on face/ open  Wound or open sores. Avoid contact with eyes, ears mouth and   genitals (private parts).                       Wash face,  Genitals (private parts) with your normal soap.             6.  Wash thoroughly, paying special attention to the area where your    surgery  will be performed.  7.  Thoroughly rinse your body with warm water from the neck down.  8.  DO NOT shower/wash with your normal soap after using and rinsing off the CHG Soap.                9.  Pat yourself dry with a clean towel.            10.  Wear clean pajamas.            11.  Place clean sheets on your bed the night of your first shower and do not  sleep with pets. Day of Surgery : Do not apply any lotions/deodorants the morning of surgery.  Please wear clean clothes to the hospital/surgery center.  FAILURE TO FOLLOW THESE INSTRUCTIONS MAY RESULT IN THE CANCELLATION OF YOUR SURGERY  PATIENT SIGNATURE_________________________________  NURSE SIGNATURE__________________________________  ________________________________________________________________________

## 2017-09-15 ENCOUNTER — Other Ambulatory Visit: Payer: Self-pay

## 2017-09-15 ENCOUNTER — Encounter (HOSPITAL_COMMUNITY): Payer: Self-pay

## 2017-09-15 ENCOUNTER — Encounter (HOSPITAL_COMMUNITY)
Admission: RE | Admit: 2017-09-15 | Discharge: 2017-09-15 | Disposition: A | Discharge status: Home / Self Care | Payer: Medicaid Other | Source: Ambulatory Visit | Attending: Surgery | Admitting: Surgery

## 2017-09-15 DIAGNOSIS — Z01812 Encounter for preprocedural laboratory examination: Secondary | ICD-10-CM | POA: Diagnosis present

## 2017-09-15 DIAGNOSIS — Z01810 Encounter for preprocedural cardiovascular examination: Secondary | ICD-10-CM | POA: Diagnosis not present

## 2017-09-15 HISTORY — DX: Personal history of other diseases of the digestive system: Z87.19

## 2017-09-15 HISTORY — DX: Adverse effect of unspecified anesthetic, initial encounter: T41.45XA

## 2017-09-15 HISTORY — DX: Dermatitis, unspecified: L30.9

## 2017-09-15 HISTORY — DX: Other complications of anesthesia, initial encounter: T88.59XA

## 2017-09-15 HISTORY — DX: Benign cyst of testis: N44.2

## 2017-09-15 HISTORY — DX: Unspecified fracture of shaft of unspecified fibula, initial encounter for closed fracture: S82.409A

## 2017-09-15 HISTORY — DX: Unspecified fracture of shaft of unspecified tibia, initial encounter for closed fracture: S82.209A

## 2017-09-15 LAB — HEMOGLOBIN A1C
HEMOGLOBIN A1C: 5.4 % (ref 4.8–5.6)
MEAN PLASMA GLUCOSE: 108.28 mg/dL

## 2017-09-15 LAB — CBC
HCT: 46.8 % (ref 39.0–52.0)
HEMOGLOBIN: 16.1 g/dL (ref 13.0–17.0)
MCH: 32.1 pg (ref 26.0–34.0)
MCHC: 34.4 g/dL (ref 30.0–36.0)
MCV: 93.2 fL (ref 78.0–100.0)
PLATELETS: 281 10*3/uL (ref 150–400)
RBC: 5.02 MIL/uL (ref 4.22–5.81)
RDW: 12.7 % (ref 11.5–15.5)
WBC: 8.9 10*3/uL (ref 4.0–10.5)

## 2017-09-15 LAB — BASIC METABOLIC PANEL
ANION GAP: 11 (ref 5–15)
BUN: 9 mg/dL (ref 6–20)
CALCIUM: 9.8 mg/dL (ref 8.9–10.3)
CO2: 24 mmol/L (ref 22–32)
CREATININE: 0.97 mg/dL (ref 0.61–1.24)
Chloride: 106 mmol/L (ref 98–111)
GFR calc Af Amer: >60 mL/min (ref >60)
Glucose, Bld: 84 mg/dL (ref 70–99)
Potassium: 3.9 mmol/L (ref 3.5–5.1)
Sodium: 141 mmol/L (ref 135–145)

## 2017-09-27 MED ORDER — BUPIVACAINE LIPOSOME 1.3 % IJ SUSP
20.0000 mL | Freq: Once | INTRAMUSCULAR | Status: DC
  Filled 2017-09-27: qty 20

## 2017-09-27 MED ORDER — DEXTROSE 5 % IV SOLN
3.0000 g | INTRAVENOUS | Status: AC
  Administered 2017-09-28: 3 g via INTRAVENOUS
  Filled 2017-09-27: qty 3

## 2017-09-28 ENCOUNTER — Encounter (HOSPITAL_COMMUNITY): Payer: Self-pay | Admitting: Certified Registered Nurse Anesthetist

## 2017-09-28 ENCOUNTER — Ambulatory Visit (HOSPITAL_COMMUNITY): Payer: Medicaid Other | Admitting: Certified Registered Nurse Anesthetist

## 2017-09-28 ENCOUNTER — Ambulatory Visit (HOSPITAL_COMMUNITY)
Admission: RE | Admit: 2017-09-28 | Discharge: 2017-09-28 | Disposition: A | Discharge status: Home / Self Care | Payer: Medicaid Other | Source: Ambulatory Visit | Attending: Surgery | Admitting: Surgery

## 2017-09-28 ENCOUNTER — Encounter (HOSPITAL_COMMUNITY)
Admission: RE | Disposition: A | Discharge status: Home / Self Care | Payer: Self-pay | Source: Ambulatory Visit | Attending: Surgery

## 2017-09-28 DIAGNOSIS — E039 Hypothyroidism, unspecified: Secondary | ICD-10-CM | POA: Diagnosis not present

## 2017-09-28 DIAGNOSIS — Z79899 Other long term (current) drug therapy: Secondary | ICD-10-CM | POA: Diagnosis not present

## 2017-09-28 DIAGNOSIS — J45909 Unspecified asthma, uncomplicated: Secondary | ICD-10-CM | POA: Diagnosis not present

## 2017-09-28 DIAGNOSIS — K4021 Bilateral inguinal hernia, without obstruction or gangrene, recurrent: Secondary | ICD-10-CM

## 2017-09-28 DIAGNOSIS — I10 Essential (primary) hypertension: Secondary | ICD-10-CM | POA: Insufficient documentation

## 2017-09-28 DIAGNOSIS — K429 Umbilical hernia without obstruction or gangrene: Secondary | ICD-10-CM | POA: Insufficient documentation

## 2017-09-28 DIAGNOSIS — K4020 Bilateral inguinal hernia, without obstruction or gangrene, not specified as recurrent: Secondary | ICD-10-CM | POA: Diagnosis not present

## 2017-09-28 HISTORY — PX: LAPAROSCOPIC INGUINAL HERNIA WITH UMBILICAL HERNIA: SHX5658

## 2017-09-28 HISTORY — PX: INSERTION OF MESH: SHX5868

## 2017-09-28 SURGERY — LAPAROSCOPIC INGUINAL HERNIA WITH UMBILICAL HERNIA
Anesthesia: General

## 2017-09-28 MED ORDER — LIDOCAINE 2% (20 MG/ML) 5 ML SYRINGE
INTRAMUSCULAR | Status: DC | PRN
  Administered 2017-09-28: 50 mg via INTRAVENOUS

## 2017-09-28 MED ORDER — ACETAMINOPHEN 500 MG PO TABS
1000.0000 mg | ORAL_TABLET | ORAL | Status: AC
  Administered 2017-09-28: 1000 mg via ORAL
  Filled 2017-09-28: qty 2

## 2017-09-28 MED ORDER — LIDOCAINE 2% (20 MG/ML) 5 ML SYRINGE
INTRAMUSCULAR | Status: DC | PRN
  Administered 2017-09-28: 1.5 mg/kg/h via INTRAVENOUS

## 2017-09-28 MED ORDER — GABAPENTIN 300 MG PO CAPS
300.0000 mg | ORAL_CAPSULE | ORAL | Status: AC
  Administered 2017-09-28: 300 mg via ORAL
  Filled 2017-09-28: qty 1

## 2017-09-28 MED ORDER — CHLORHEXIDINE GLUCONATE CLOTH 2 % EX PADS: 6.0000 | MEDICATED_PAD | Freq: Once | CUTANEOUS | Status: DC

## 2017-09-28 MED ORDER — ROCURONIUM BROMIDE 10 MG/ML (PF) SYRINGE
PREFILLED_SYRINGE | INTRAVENOUS | Status: AC
  Filled 2017-09-28: qty 10

## 2017-09-28 MED ORDER — FENTANYL CITRATE (PF) 250 MCG/5ML IJ SOLN
INTRAMUSCULAR | Status: AC
  Filled 2017-09-28: qty 5

## 2017-09-28 MED ORDER — STERILE WATER FOR IRRIGATION IR SOLN
Status: DC | PRN
  Administered 2017-09-28: 1000 mL

## 2017-09-28 MED ORDER — FENTANYL CITRATE (PF) 100 MCG/2ML IJ SOLN
INTRAMUSCULAR | Status: AC
  Filled 2017-09-28: qty 2

## 2017-09-28 MED ORDER — MIDAZOLAM HCL 2 MG/2ML IJ SOLN
INTRAMUSCULAR | Status: AC
  Filled 2017-09-28: qty 2

## 2017-09-28 MED ORDER — SUGAMMADEX SODIUM 200 MG/2ML IV SOLN
INTRAVENOUS | Status: AC
  Filled 2017-09-28: qty 2

## 2017-09-28 MED ORDER — BUPIVACAINE-EPINEPHRINE 0.25% -1:200000 IJ SOLN
INTRAMUSCULAR | Status: DC | PRN
  Administered 2017-09-28: 60 mL

## 2017-09-28 MED ORDER — DIPHENHYDRAMINE HCL 50 MG/ML IJ SOLN
INTRAMUSCULAR | Status: AC
  Filled 2017-09-28: qty 1

## 2017-09-28 MED ORDER — LIDOCAINE 2% (20 MG/ML) 5 ML SYRINGE
INTRAMUSCULAR | Status: AC
  Filled 2017-09-28: qty 5

## 2017-09-28 MED ORDER — DEXAMETHASONE SODIUM PHOSPHATE 10 MG/ML IJ SOLN
INTRAMUSCULAR | Status: AC
  Filled 2017-09-28: qty 1

## 2017-09-28 MED ORDER — MIDAZOLAM HCL 5 MG/5ML IJ SOLN
INTRAMUSCULAR | Status: DC | PRN
  Administered 2017-09-28: 2 mg via INTRAVENOUS

## 2017-09-28 MED ORDER — FENTANYL CITRATE (PF) 100 MCG/2ML IJ SOLN
INTRAMUSCULAR | Status: DC | PRN
  Administered 2017-09-28: 50 ug via INTRAVENOUS
  Administered 2017-09-28 (×2): 100 ug via INTRAVENOUS
  Administered 2017-09-28 (×2): 50 ug via INTRAVENOUS

## 2017-09-28 MED ORDER — PROPOFOL 10 MG/ML IV BOLUS
INTRAVENOUS | Status: AC
  Filled 2017-09-28: qty 20

## 2017-09-28 MED ORDER — NAPROXEN 500 MG PO TABS: 500.0000 mg | ORAL_TABLET | Freq: Two times a day (BID) | ORAL | 1 refills | Status: AC

## 2017-09-28 MED ORDER — BUPIVACAINE-EPINEPHRINE (PF) 0.25% -1:200000 IJ SOLN
INTRAMUSCULAR | Status: AC
  Filled 2017-09-28: qty 90

## 2017-09-28 MED ORDER — ONDANSETRON HCL 4 MG/2ML IJ SOLN
INTRAMUSCULAR | Status: AC
  Filled 2017-09-28: qty 2

## 2017-09-28 MED ORDER — LIDOCAINE HCL 2 % IJ SOLN
INTRAMUSCULAR | Status: AC
  Filled 2017-09-28: qty 20

## 2017-09-28 MED ORDER — OXYCODONE HCL 5 MG PO TABS: 5.0000 mg | ORAL_TABLET | Freq: Four times a day (QID) | ORAL | 0 refills | Status: AC | PRN

## 2017-09-28 MED ORDER — DEXAMETHASONE SODIUM PHOSPHATE 4 MG/ML IJ SOLN
INTRAMUSCULAR | Status: DC | PRN
  Administered 2017-09-28: 10 mg via INTRAVENOUS

## 2017-09-28 MED ORDER — FENTANYL CITRATE (PF) 100 MCG/2ML IJ SOLN
25.0000 ug | INTRAMUSCULAR | Status: DC | PRN
  Administered 2017-09-28: 50 ug via INTRAVENOUS

## 2017-09-28 MED ORDER — LACTATED RINGERS IV SOLN
INTRAVENOUS | Status: DC
  Administered 2017-09-28: 11:00:00 via INTRAVENOUS

## 2017-09-28 MED ORDER — PROPOFOL 10 MG/ML IV BOLUS
INTRAVENOUS | Status: DC | PRN
  Administered 2017-09-28: 200 mg via INTRAVENOUS

## 2017-09-28 MED ORDER — KETAMINE HCL 10 MG/ML IJ SOLN
INTRAMUSCULAR | Status: DC | PRN
  Administered 2017-09-28: 40 mg via INTRAVENOUS

## 2017-09-28 MED ORDER — BUPIVACAINE LIPOSOME 1.3 % IJ SUSP
INTRAMUSCULAR | Status: DC | PRN
  Administered 2017-09-28: 20 mL

## 2017-09-28 MED ORDER — PROMETHAZINE HCL 25 MG/ML IJ SOLN: 6.2500 mg | INTRAMUSCULAR | Status: DC | PRN

## 2017-09-28 MED ORDER — ONDANSETRON HCL 4 MG/2ML IJ SOLN
INTRAMUSCULAR | Status: DC | PRN
  Administered 2017-09-28: 4 mg via INTRAVENOUS

## 2017-09-28 MED ORDER — DIPHENHYDRAMINE HCL 50 MG/ML IJ SOLN
INTRAMUSCULAR | Status: DC | PRN
  Administered 2017-09-28: 12.5 mg via INTRAVENOUS

## 2017-09-28 MED ORDER — SUGAMMADEX SODIUM 200 MG/2ML IV SOLN
INTRAVENOUS | Status: DC | PRN
  Administered 2017-09-28: 300 mg via INTRAVENOUS

## 2017-09-28 MED ORDER — CELECOXIB 200 MG PO CAPS
200.0000 mg | ORAL_CAPSULE | ORAL | Status: AC
  Administered 2017-09-28: 200 mg via ORAL
  Filled 2017-09-28: qty 1

## 2017-09-28 MED ORDER — ROCURONIUM BROMIDE 10 MG/ML (PF) SYRINGE
PREFILLED_SYRINGE | INTRAVENOUS | Status: DC | PRN
  Administered 2017-09-28: 10 mg via INTRAVENOUS
  Administered 2017-09-28: 70 mg via INTRAVENOUS

## 2017-09-28 MED ORDER — 0.9 % SODIUM CHLORIDE (POUR BTL) OPTIME
TOPICAL | Status: DC | PRN
  Administered 2017-09-28: 1000 mL

## 2017-09-28 SURGICAL SUPPLY — 38 items
CABLE HIGH FREQUENCY MONO STRZ (ELECTRODE) ×4 IMPLANT
CHLORAPREP W/TINT 26ML (MISCELLANEOUS) ×4 IMPLANT
COVER SURGICAL LIGHT HANDLE (MISCELLANEOUS) ×4 IMPLANT
DECANTER SPIKE VIAL GLASS SM (MISCELLANEOUS) ×4 IMPLANT
DEVICE SECURE STRAP 25 ABSORB (INSTRUMENTS) IMPLANT
DRAPE WARM FLUID 44X44 (DRAPE) ×4 IMPLANT
DRSG TEGADERM 2-3/8X2-3/4 SM (GAUZE/BANDAGES/DRESSINGS) ×8 IMPLANT
DRSG TEGADERM 4X4.75 (GAUZE/BANDAGES/DRESSINGS) ×4 IMPLANT
ELECT REM PT RETURN 15FT ADLT (MISCELLANEOUS) ×4 IMPLANT
GAUZE SPONGE 2X2 8PLY STRL LF (GAUZE/BANDAGES/DRESSINGS) ×2 IMPLANT
GLOVE BIOGEL PI IND STRL 7.0 (GLOVE) IMPLANT
GLOVE BIOGEL PI INDICATOR 7.0 (GLOVE) ×4
GLOVE ECLIPSE 8.0 STRL XLNG CF (GLOVE) ×4 IMPLANT
GLOVE INDICATOR 8.0 STRL GRN (GLOVE) ×4 IMPLANT
GLOVE SURG SS PI 7.0 STRL IVOR (GLOVE) ×4 IMPLANT
GOWN STRL REUS W/TWL XL LVL3 (GOWN DISPOSABLE) ×10 IMPLANT
IRRIG SUCT STRYKERFLOW 2 WTIP (MISCELLANEOUS)
IRRIGATION SUCT STRKRFLW 2 WTP (MISCELLANEOUS) IMPLANT
KIT BASIN OR (CUSTOM PROCEDURE TRAY) ×4 IMPLANT
MESH ULTRAPRO 6X6 15CM15CM (Mesh General) ×6 IMPLANT
NDL INSUFFLATION 14GA 120MM (NEEDLE) IMPLANT
NEEDLE INSUFFLATION 14GA 120MM (NEEDLE) IMPLANT
PAD POSITIONING PINK XL (MISCELLANEOUS) ×4 IMPLANT
SCISSORS LAP 5X35 DISP (ENDOMECHANICALS) ×4 IMPLANT
SLEEVE ADV FIXATION 5X100MM (TROCAR) ×4 IMPLANT
SPONGE GAUZE 2X2 STER 10/PKG (GAUZE/BANDAGES/DRESSINGS) ×2
SUT MNCRL AB 4-0 PS2 18 (SUTURE) ×4 IMPLANT
SUT PDS AB 1 CT1 27 (SUTURE) ×8 IMPLANT
SUT VIC AB 2-0 SH 27 (SUTURE) ×4
SUT VIC AB 2-0 SH 27X BRD (SUTURE) IMPLANT
SUT VICRYL 0 UR6 27IN ABS (SUTURE) ×4 IMPLANT
TACKER 5MM HERNIA 3.5CML NAB (ENDOMECHANICALS) IMPLANT
TOWEL OR 17X26 10 PK STRL BLUE (TOWEL DISPOSABLE) ×4 IMPLANT
TOWEL OR NON WOVEN STRL DISP B (DISPOSABLE) ×4 IMPLANT
TRAY LAPAROSCOPIC (CUSTOM PROCEDURE TRAY) ×4 IMPLANT
TROCAR ADV FIXATION 5X100MM (TROCAR) ×4 IMPLANT
TROCAR XCEL BLUNT TIP 100MML (ENDOMECHANICALS) ×4 IMPLANT
TUBING INSUF HEATED (TUBING) ×4 IMPLANT

## 2017-09-28 NOTE — Anesthesia Postprocedure Evaluation (Signed)
Anesthesia Post Note  Patient: Edwin Lee  Procedure(s) Performed: LAPAROSCOPIC BILATERAL INGUINAL HERNIA WITH UMBILICAL HERNIA (Bilateral ) INSERTION OF MESH (N/A )     Patient location during evaluation: PACU Anesthesia Type: General Level of consciousness: awake and alert, oriented and awake Pain management: pain level controlled Vital Signs Assessment: post-procedure vital signs reviewed and stable Respiratory status: spontaneous breathing, nonlabored ventilation and respiratory function stable Cardiovascular status: blood pressure returned to baseline and stable Postop Assessment: no apparent nausea or vomiting Anesthetic complications: no    Last Vitals:  Vitals:   09/28/17 1515 09/28/17 1545  BP: (!) 154/93 (!) 139/94  Pulse: 84 80  Resp: 13 17  Temp:  36.8 C  SpO2: 99% 100%    Last Pain:  Vitals:   09/28/17 1545  TempSrc:   PainSc: 3                  Cecile HearingStephen Edward Turk

## 2017-09-28 NOTE — Anesthesia Preprocedure Evaluation (Addendum)
Anesthesia Evaluation  Patient identified by MRN, date of birth, ID band Patient awake    Reviewed: Allergy & Precautions, NPO status , Patient's Chart, lab work & pertinent test results  History of Anesthesia Complications (+) DIFFICULT AIRWAY and history of anesthetic complications  Airway Mallampati: II  TM Distance: >3 FB Neck ROM: Full    Dental  (+) Teeth Intact, Dental Advisory Given   Pulmonary neg pulmonary ROS,    Pulmonary exam normal breath sounds clear to auscultation       Cardiovascular hypertension, Normal cardiovascular exam Rhythm:Regular Rate:Normal     Neuro/Psych negative neurological ROS  negative psych ROS   GI/Hepatic Neg liver ROS, GERD  ,BILATERAL INGUINAL RECURRENT HERNIA. UMBILICAL HERNIA   Endo/Other  Hypothyroidism Obesity   Renal/GU negative Renal ROS     Musculoskeletal negative musculoskeletal ROS (+)   Abdominal   Peds  Hematology negative hematology ROS (+)   Anesthesia Other Findings Day of surgery medications reviewed with the patient.  Reproductive/Obstetrics                             Anesthesia Physical Anesthesia Plan  ASA: II  Anesthesia Plan: General   Post-op Pain Management:    Induction: Intravenous  PONV Risk Score and Plan: 4 or greater and Midazolam, Diphenhydramine, Dexamethasone and Ondansetron  Airway Management Planned: Oral ETT  Additional Equipment:   Intra-op Plan:   Post-operative Plan: Extubation in OR  Informed Consent: I have reviewed the patients History and Physical, chart, labs and discussed the procedure including the risks, benefits and alternatives for the proposed anesthesia with the patient or authorized representative who has indicated his/her understanding and acceptance.   Dental advisory given  Plan Discussed with: CRNA  Anesthesia Plan Comments:         Anesthesia Quick Evaluation

## 2017-09-28 NOTE — Interval H&P Note (Signed)
History and Physical Interval Note:  09/28/2017 11:20 AM  Edwin Lee  has presented today for surgery, with the diagnosis of BILATERAL INGUINAL RECURRENT HERNIA. UMBILICAL HERNIA  The various methods of treatment have been discussed with the patient and family. After consideration of risks, benefits and other options for treatment, the patient has consented to  Procedure(s): LAPAROSCOPIC BILATERAL INGUINAL HERNIA WITH UMBILICAL HERNIA WITH MESH (Bilateral) INSERTION OF MESH (N/A) as a surgical intervention .  The patient's history has been reviewed, patient examined, no change in status, stable for surgery.  I have reviewed the patient's chart and labs.  Questions were answered to the patient's satisfaction.    I have re-reviewed the the patient's records, history, medications, and allergies.  I have re-examined the patient.  I again discussed intraoperative plans and goals of post-operative recovery.  The patient agrees to proceed.  Edwin Lee  08-05-1995 [id number]  Patient Care Team: Ettefagh, Edwin Baba, MD as PCP - General (Pediatrics)  Patient Active Problem List   Diagnosis Date Noted  . Left varicocele 07/25/2014  . Eczema 09/13/2013  . Severe obesity (BMI >= 40) (HCC) 06/07/2013  . Hypertriglyceridemia 12/03/2012  . Hypothyroidism, acquired, autoimmune   . Thyroiditis, autoimmune   . Goiter   . Gynecomastia, male   . Dyspepsia   . GERD (gastroesophageal reflux disease)   . Hypertension   . Pre-diabetes 08/09/2010    Past Medical History:  Diagnosis Date  . Complication of anesthesia    Pt unaware of any problems in the past with difficult airway  . Dyspepsia   . Eczema   . GERD (gastroesophageal reflux disease)   . Goiter   . Gynecomastia, male   . History of inguinal hernia   . Hypertension    no currently taking medication, unable to afford  . Hypothyroidism, acquired, autoimmune   . Obesity   . Pre-diabetes   . Testicular cyst 08/27/2017   Right 6mm   . Thyroiditis, autoimmune   . Tibia/fibula fracture 07/2010   Distal    Past Surgical History:  Procedure Laterality Date  . INGUINAL HERNIA REPAIR    . left foot      Social History   Socioeconomic History  . Marital status: Single    Spouse name: Not on file  . Number of children: Not on file  . Years of education: Not on file  . Highest education level: Not on file  Occupational History  . Not on file  Social Needs  . Financial resource strain: Not on file  . Food insecurity:    Worry: Not on file    Inability: Not on file  . Transportation needs:    Medical: Not on file    Non-medical: Not on file  Tobacco Use  . Smoking status: Never Smoker  . Smokeless tobacco: Never Used  Substance and Sexual Activity  . Alcohol use: No  . Drug use: No  . Sexual activity: Not on file  Lifestyle  . Physical activity:    Days per week: Not on file    Minutes per session: Not on file  . Stress: Not on file  Relationships  . Social connections:    Talks on phone: Not on file    Gets together: Not on file    Attends religious service: Not on file    Active member of club or organization: Not on file    Attends meetings of clubs or organizations: Not on file    Relationship status: Not  on file  . Intimate partner violence:    Fear of current or ex partner: Not on file    Emotionally abused: Not on file    Physically abused: Not on file    Forced sexual activity: Not on file  Other Topics Concern  . Not on file  Social History Narrative   Lives with parents, brother and sister.     Family History  Problem Relation Age of Onset  . Hypertension Father   . Thyroid disease Maternal Uncle        one maternal uncle has high thryoid blood tests. The other uncle has low blood tests.  . Hypertension Paternal Uncle   . Diabetes Maternal Grandmother   . Hypertension Paternal Grandmother   . Obesity Neg Hx   . Kidney disease Neg Hx   . Cancer Neg Hx   . Anemia Neg Hx      No medications prior to admission.    Current Facility-Administered Medications  Medication Dose Route Frequency Provider Last Rate Last Dose  . bupivacaine liposome (EXPAREL) 1.3 % injection 266 mg  20 mL Infiltration Once Karie SodaGross, Yobany Vroom, MD      . ceFAZolin (ANCEF) 3 g in dextrose 5 % 50 mL IVPB  3 g Intravenous On Call to OR Karie SodaGross, Almetta Liddicoat, MD      . Chlorhexidine Gluconate Cloth 2 % PADS 6 each  6 each Topical Once Karie SodaGross, Arianna Delsanto, MD      . lactated ringers infusion   Intravenous Continuous Cecile Hearingurk, Stephen Edward, MD 50 mL/hr at 09/28/17 1051       No Known Allergies  BP (!) 162/89   Pulse 88   Temp 98 F (36.7 C) (Oral)   Resp 18   Ht 6' (1.829 m)   Wt 112.6 kg   SpO2 97%   BMI 33.67 kg/m   Labs: No results found for this or any previous visit (from the past 48 hour(s)).  Imaging / Studies: No results found.   Ardeth Sportsman.Edwin Lee C. Lamount Bankson, M.D., F.A.C.S. Gastrointestinal and Minimally Invasive Surgery [facility] [address] [place], [place] [address] [phone] Main / Paging  09/28/2017 11:20 AM     Ardeth SportsmanSteven C Taige Lee

## 2017-09-28 NOTE — Op Note (Addendum)
09/28/2017  2:03 PM  PATIENT:  Edwin Lee  22 y.o. male  Patient Care Team: Ettefagh, Aron Baba, MD as PCP - General (Pediatrics) Karie Soda, MD as Consulting Physician (General Surgery)  PRE-OPERATIVE DIAGNOSIS:   RECURRENT BILATERAL INGUINAL HERNIAs. UMBILICAL HERNIA  POST-OPERATIVE DIAGNOSIS:  RECURRENT BILATERAL INGUINAL HERNIAs.  UMBILICAL HERNIA  PROCEDURE:   LAPAROSCOPIC BILATERAL INGUINAL HERNIA REPAIR WITH MESH PRIMARY UMBILICAL HERNIA REPAIR  SURGEON:  Ardeth Sportsman, MD  ASSISTANT: None  ANESTHESIA:     Regional ilioinguinal and genitofemoral and spermatic cord nerve blocks  General  EBL:  Total I/O In: -  Out: 20 [Blood:20].  See anesthesia record  Delay start of Pharmacological VTE agent (>24hrs) due to surgical blood loss or risk of bleeding:  no  DRAINS: NONE  SPECIMEN:  NONE  DISPOSITION OF SPECIMEN:  N/A  COUNTS:  YES  PLAN OF CARE: Discharge to home after PACU  PATIENT DISPOSITION:  PACU - hemodynamically stable.  INDICATION: Pleasant young male with history of bilateral inguinal hernias repaired when he was an infant.  Developed groin pain and discomfort especially on the left side.  Persisted.  Inguinal hernia suspected.  Sensitivity in right groin as well.  Small but sensitive umbilical hernia noted.  I recommended laparoscopic exploration and repair of hernias found  The anatomy & physiology of the abdominal wall and pelvic floor was discussed.  The pathophysiology of hernias in the inguinal and pelvic region was discussed.  Natural history risks such as progressive enlargement, pain, incarceration & strangulation was discussed.   Contributors to complications such as smoking, obesity, diabetes, prior surgery, etc were discussed.    I feel the risks of no intervention will lead to serious problems that outweigh the operative risks; therefore, I recommended surgery to reduce and repair the hernia.  I explained laparoscopic techniques  with possible need for an open approach.  I noted usual use of mesh to patch and/or buttress hernia repair  Risks such as bleeding, infection, abscess, need for further treatment, heart attack, death, and other risks were discussed.  I noted a good likelihood this will help address the problem.   Goals of post-operative recovery were discussed as well.  Possibility that this will not correct all symptoms was explained.  I stressed the importance of low-impact activity, aggressive pain control, avoiding constipation, & not pushing through pain to minimize risk of post-operative chronic pain or injury. Possibility of reherniation was discussed.  We will work to minimize complications.     An educational handout further explaining the pathology & treatment options was given as well.  Questions were answered.  The patient expresses understanding & wishes to proceed with surgery.  OR FINDINGS: Small indirect bilateral inguinal hernias.  No direct space, femoral, obturator hernias.  Small umbilical hernia 1cm within the stalk  DESCRIPTION:  The patient was identified & brought into the operating room. The patient was positioned supine with arms tucked. SCDs were active during the entire case. The patient underwent general anesthesia without any difficulty.  The abdomen was prepped and draped in a sterile fashion. The patient's bladder was emptied.  A Surgical Timeout confirmed our plan.  I made a transverse incision through the inferior umbilical fold.  I made a small transverse nick through the anterior rectus fascia contralateral to the inguinal hernia side and placed a 0-vicryl stitch through the fascia.  I placed a Hasson trocar into the preperitoneal plane.  Entry was clean.  We induced carbon dioxide insufflation. Camera  inspection revealed no injury.  I used a 10mm angled scope to bluntly free the peritoneum off the infraumbilical anterior abdominal wall.  I created enough of a preperitoneal pocket to  place 5mm ports into the right & left mid-abdomen into this preperitoneal cavity.  I focused attention on the RIGHT pelvis.   I used blunt & focused sharp dissection to free the peritoneum off the flank and down to the pubic rim.  I freed the anteriolateral bladder wall off the anteriolateral pelvic wall, sparing midline attachments.   I located a swath of peritoneum going into a hernia fascial defect at the  internal ring consistent with  an indirect inguinal hernia..  I gradually freed the peritoneal hernia sac off safely and reduced it into the preperitoneal space.  I freed the peritoneum off the spermatic vessels & vas deferens.  I freed peritoneum off the retroperitoneum along the psoas muscle.  Spermatic cord lipoma was dissected away & removed.  Some preperitoneal fat relieved off the anterior abdominal wall.  Some fat noted along the iliac vessels freed off.  No major iliac lymphadenopathy noted.  I checked & assured hemostasis.     I turned attention on the opposite  LEFT pelvis.  I did dissection in a similar, mirror-image fashion. The patient had an indirect inguinal hernia..   No major spermatic cord lipomas.  Again some adipose tissue removed off the anterior abdominal wall and along the psoas.  I checked & assured hemostasis.     I chose 15x15 cm sheets of ultra-lightweight polypropylene mesh (Ultrapro), one for each side.  I cut a single sigmoid-shaped slit ~6cm from a corner of each mesh.  I placed the meshes into the preperitoneal space & laid them as overlapping diamonds such that at the inferior points, a 6x6 cm corner flap rested in the true anterolateral pelvis, covering the obturator & femoral foramina.   I allowed the bladder to return to the pubis, this helping tuck the corners of the mesh in the anteriolateral pelvis.  The medial corners overlapped each other across midline cephalad to the pubic rim.   Given the recurrent hernias and his obesity, I placed a third 15x15cm mesh in the  center as a vertical diamond.  The lateral wings of the mesh overlap across the direct spaces and internal rings where the dominant hernias were.  This provided good coverage and reinforcement of the hernia repairs.  Because of the central mesh placement with good overlap, I did not place any tacks.   I held the hernia sacs cephalad & evacuated carbon dioxide.  I closed the fascia with absorbable suture.  Umbilical hernia repaired with #1 PDS interrupted transversely.  I closed the skin using 4-0 monocryl stitch.  Sterile dressings were applied.   The patient was extubated & arrived in the PACU in stable condition..  I had discussed postoperative care with the patient in the holding area.  Instructions are written in the chart.  I discussed operative findings, updated the patient's status, discussed probable steps to recovery, and gave postoperative recommendations to the patient's spouse.  Recommendations were made.  Questions were answered.  She expressed understanding & appreciation.   Ardeth SportsmanSteven C. Janelys Glassner, M.D., F.A.C.S. Gastrointestinal and Minimally Invasive Surgery [facility] [address] [place], [place] [address] [phone] Main / Paging  09/28/2017 2:03 PM

## 2017-09-28 NOTE — Discharge Instructions (Signed)
HERNIA REPAIR: POST OP INSTRUCTIONS ° °###################################################################### ° °EAT °Gradually transition to a high fiber diet with a fiber supplement over the next few weeks after discharge.  Start with a pureed / full liquid diet (see below) ° °WALK °Walk an hour a day.  Control your pain to do that.   ° °CONTROL PAIN °Control pain so that you can walk, sleep, tolerate sneezing/coughing, and go up/down stairs. ° °HAVE A BOWEL MOVEMENT DAILY °Keep your bowels regular to avoid problems.  OK to try a laxative to override constipation.  OK to use an antidairrheal to slow down diarrhea.  Call if not better after 2 tries ° °CALL IF YOU HAVE PROBLEMS/CONCERNS °Call if you are still struggling despite following these instructions. °Call if you have concerns not answered by these instructions ° °###################################################################### ° ° ° °1. DIET: Follow a light bland diet the first 24 hours after arrival home, such as soup, liquids, crackers, etc.  Be sure to include lots of fluids daily.  Advance to a low fat / high fiber diet over the next few days after surgery.  Avoid fast food or heavy meals the first week as your are more likely to get nauseated.   ° °2. Take your usually prescribed home medications unless otherwise directed. ° °3. PAIN CONTROL: °a. Pain is best controlled by a usual combination of three different methods TOGETHER: °i. Ice/Heat °ii. Over the counter pain medication °iii. Prescription pain medication °b. Most patients will experience some swelling and bruising around the hernia(s) such as the bellybutton, groins, or old incisions.  Ice packs or heating pads (30-60 minutes up to 6 times a day) will help. Use ice for the first few days to help decrease swelling and bruising, then switch to heat to help relax tight/sore spots and speed recovery.  Some people prefer to use ice alone, heat alone, alternating between ice & heat.  Experiment  to what works for you.  Swelling and bruising can take several weeks to resolve.   °c. It is helpful to take an over-the-counter pain medication regularly for the first few weeks.  Choose one of the following that works best for you: °i. Naproxen (Aleve, etc)  Two 220mg tabs twice a day °ii. Ibuprofen (Advil, etc) Three 200mg tabs four times a day (every meal & bedtime) °iii. Acetaminophen (Tylenol, etc) 325-650mg four times a day (every meal & bedtime) °d. A  prescription for pain medication should be given to you upon discharge.  Take your pain medication as prescribed.  °i. If you are having problems/concerns with the prescription medicine (does not control pain, nausea, vomiting, rash, itching, etc), please call us [phone] to see if we need to switch you to a different pain medicine that will work better for you and/or control your side effect better. °ii. If you need a refill on your pain medication, please contact your pharmacy.  They will contact our office to request authorization. Prescriptions will not be filled after 5 pm or on week-ends. ° °4. Avoid getting constipated.  Between the surgery and the pain medications, it is common to experience some constipation.  Increasing fluid intake and taking a fiber supplement (such as Metamucil, Citrucel, FiberCon, MiraLax, etc) 1-2 times a day regularly will usually help prevent this problem from occurring.  A mild laxative (prune juice, Milk of Magnesia, MiraLax, etc) should be taken according to package directions if there are no bowel movements after 48 hours.   ° °5. Wash / shower every   day.  You may shower over the dressings as they are waterproof.   ° °6. Remove your waterproof bandages, skin tapes, and other bandages 5 days after surgery. You may replace a dressing/Band-Aid to cover the incision for comfort if you wish. You may leave the incisions open to air.  You may replace a dressing/Band-Aid to cover an incision for comfort if you wish.   Continue to shower over incision(s) after the dressing is off. ° °7. ACTIVITIES as tolerated:   °a. You may resume regular (light) daily activities beginning the next day--such as daily self-care, walking, climbing stairs--gradually increasing activities as tolerated.  Control your pain so that you can walk an hour a day.  If you can walk 30 minutes without difficulty, it is safe to try more intense activity such as jogging, treadmill, bicycling, low-impact aerobics, swimming, etc. °b. Save the most intensive and strenuous activity for last such as sit-ups, heavy lifting, contact sports, etc  Refrain from any heavy lifting or straining until you are off narcotics for pain control.   °c. DO NOT PUSH THROUGH PAIN.  Let pain be your guide: If it hurts to do something, don't do it.  Pain is your body warning you to avoid that activity for another week until the pain goes down. °d. You may drive when you are no longer taking prescription pain medication, you can comfortably wear a seatbelt, and you can safely maneuver your car and apply brakes. °e. You may have sexual intercourse when it is comfortable.  ° °8. FOLLOW UP in our office °a. Please call [facility] at [phone] to set up an appointment to see your surgeon in the office for a follow-up appointment approximately 2-3 weeks after your surgery. °b. Make sure that you call for this appointment the day you arrive home to insure a convenient appointment time. ° °9.  If you have disability of FMLA / Family leave forms, please bring the forms to the office for processing.  (do not give to your surgeon). ° °WHEN TO CALL US [phone]: °1. Poor pain control °2. Reactions / problems with new medications (rash/itching, nausea, etc)  °3. Fever over 101.5 F (38.5 C) °4. Inability to urinate °5. Nausea and/or vomiting °6. Worsening swelling or bruising °7. Continued bleeding from incision. °8. Increased pain, redness, or drainage from the incision ° ° The clinic staff is  available to answer your questions during regular business hours (8:30am-5pm).  Please don’t hesitate to call and ask to speak to one of our nurses for clinical concerns.  ° If you have a medical emergency, go to the nearest emergency room or call 911. ° A surgeon from [facility] is always on call at the hospitals in [place] ° °[facility] °[address], [address], [place], [place]  [address] ? ° [address] [address], [place], [place]   [address] °MAIN: [phone] ? TOLL FREE: [phone] ? FAX: [phone] °[email or web address] ° ° ° °Inguinal Hernia, Adult °An inguinal hernia is when fat or the intestines push through the area where the leg meets the lower abdomen (groin) and create a rounded lump (bulge). This condition develops over time. There are three types of inguinal hernias. These types include: °· Hernias that can be pushed back into the belly (are reducible). °· Hernias that are not reducible (are incarcerated). °· Hernias that are not reducible and lose their blood supply (are strangulated). This type of hernia requires emergency surgery. ° °What are the   causes? °This condition is caused by having a weak spot in the muscles or tissue. This weakness lets the hernia poke through. This condition can be triggered by: °· Suddenly straining the muscles of the lower abdomen. °· Lifting heavy objects. °· Straining to have a bowel movement. Difficult bowel movements (constipation) can lead to this. °· Coughing. ° °What increases the risk? °This condition is more likely to develop in: °· Men. °· Pregnant women. °· People who: °? Are overweight. °? Work in jobs that require long periods of standing or heavy lifting. °? Have had an inguinal hernia before. °? Smoke or have lung disease. These factors can lead to long-lasting (chronic) coughing. ° °What are the signs or symptoms? °Symptoms can depend on the size of the hernia. Often, a small inguinal hernia has no  symptoms. Symptoms of a larger hernia include: °· A lump in the groin. This is easier to see when the person is standing. It might not be visible when he or she is lying down. °· Pain or burning in the groin. This occurs especially when lifting, straining, or coughing. °· A dull ache or a feeling of pressure in the groin. °· A lump in the scrotum in men. ° °Symptoms of a strangulated inguinal hernia can include: °· A bulge in the groin that is very painful and tender to the touch. °· A bulge that turns red or purple. °· Fever, nausea, and vomiting. °· The inability to have a bowel movement or to pass gas. ° °How is this diagnosed? °This condition is diagnosed with a medical history and physical exam. Your health care provider may feel your groin area and ask you to cough. °How is this treated? °Treatment for this condition varies depending on the size of your hernia and whether you have symptoms. If you do not have symptoms, your health care provider may have you watch your hernia carefully and come in for follow-up visits. If your hernia is larger or if you have symptoms, your treatment will include surgery. °Follow these instructions at home: °Lifestyle °· Drink enough fluid to keep your urine clear or pale yellow. °· Eat a diet that includes a lot of fiber. Eat plenty of fruits, vegetables, and whole grains. Talk with your health care provider if you have questions. °· Avoid lifting heavy objects. °· Avoid standing for long periods of time. °· Do not use tobacco products, including cigarettes, chewing tobacco, or e-cigarettes. If you need help quitting, ask your health care provider. °· Maintain a healthy weight. °General instructions °· Do not try to force the hernia back in. °· Watch your hernia for any changes in color or size. Let your health care provider know if any changes occur. °· Take over-the-counter and prescription medicines only as told by your health care provider. °· Keep all follow-up visits as  told by your health care provider. This is important. °Contact a health care provider if: °· You have a fever. °· You have new symptoms. °· Your symptoms get worse. °Get help right away if: °· You have pain in the groin that suddenly gets worse. °· A bulge in the groin gets bigger suddenly and does not go down. °· You are a man and you have a sudden pain in the scrotum, or the size of your scrotum suddenly changes. °· A bulge in the groin area becomes red or purple and is painful to the touch. °· You have nausea or vomiting that does not go away. °· You   feel your heart beating a lot more quickly than normal. °· You cannot have a bowel movement or pass gas. °This information is not intended to replace advice given to you by your health care provider. Make sure you discuss any questions you have with your health care provider. °Document Released: 06/26/2008 Document Revised: 07/16/2015 Document Reviewed: 12/18/2013 °Elsevier Interactive Patient Education © 2018 [employer] ° °

## 2017-09-28 NOTE — Transfer of Care (Signed)
Immediate Anesthesia Transfer of Care Note  Patient: Edwin Lee  Procedure(s) Performed: LAPAROSCOPIC BILATERAL INGUINAL HERNIA WITH UMBILICAL HERNIA (Bilateral ) INSERTION OF MESH (N/A )  Patient Location: PACU  Anesthesia Type:General  Level of Consciousness: drowsy  Airway & Oxygen Therapy: Patient Spontanous Breathing and Patient connected to face mask  Post-op Assessment: Report given to RN and Post -op Vital signs reviewed and stable  Post vital signs: Reviewed and stable  Last Vitals:  Vitals Value Taken Time  BP    Temp    Pulse    Resp    SpO2      Last Pain:  Vitals:   09/28/17 1050  TempSrc:   PainSc: 0-No pain      Patients Stated Pain Goal: 4 (09/28/17 1050)  Complications: No apparent anesthesia complications

## 2017-09-28 NOTE — Anesthesia Procedure Notes (Signed)
Procedure Name: Intubation Date/Time: 09/28/2017 12:21 PM Performed by: Vanessa Durhamochran, Syndi Pua Glenn, CRNA Pre-anesthesia Checklist: Patient identified, Emergency Drugs available, Suction available and Patient being monitored Patient Re-evaluated:Patient Re-evaluated prior to induction Oxygen Delivery Method: Circle system utilized Preoxygenation: Pre-oxygenation with 100% oxygen Induction Type: IV induction Ventilation: Mask ventilation without difficulty Laryngoscope Size: 2 and Miller Grade View: Grade I Tube type: Oral Tube size: 7.5 mm Number of attempts: 1 Airway Equipment and Method: Stylet Placement Confirmation: ETT inserted through vocal cords under direct vision,  positive ETCO2 and breath sounds checked- equal and bilateral Secured at: 23 cm Tube secured with: Tape Dental Injury: Teeth and Oropharynx as per pre-operative assessment

## 2017-09-29 ENCOUNTER — Encounter (HOSPITAL_COMMUNITY): Payer: Self-pay | Admitting: Surgery

## 2018-08-02 ENCOUNTER — Ambulatory Visit (INDEPENDENT_AMBULATORY_CARE_PROVIDER_SITE_OTHER): Payer: Self-pay | Admitting: Pediatric Endocrinology

## 2020-04-28 IMAGING — US US SCROTUM W/ DOPPLER COMPLETE
1 series · 14 of 25 positions shown · non-contrast
Comparison: None.

CLINICAL DATA: Left testicular pain for 3 days.

EXAM:
SCROTAL ULTRASOUND
DOPPLER ULTRASOUND OF THE TESTICLES
TECHNIQUE: Complete ultrasound examination of the testicles, epididymis, and
other scrotal structures was performed. Color and spectral Doppler
ultrasound were also utilized to evaluate blood flow to the
testicles.

[Series 1: us scrotum w/ doppler complete · 14 of 116 slices shown]
[im 1/116]
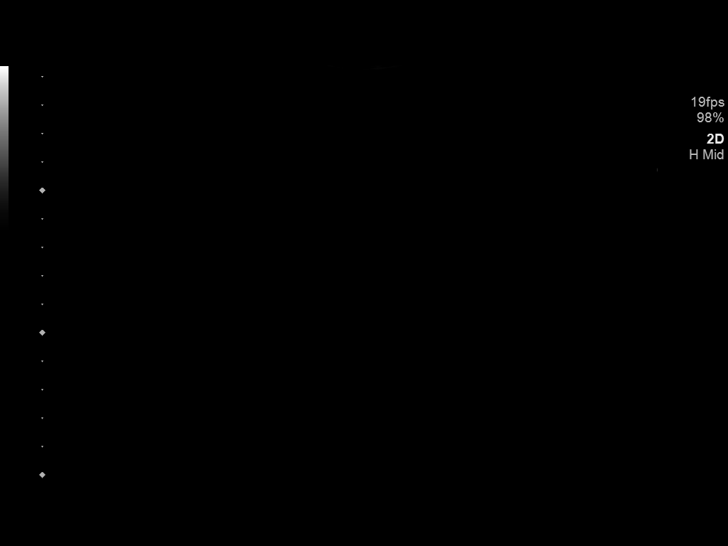
[im 10/116]
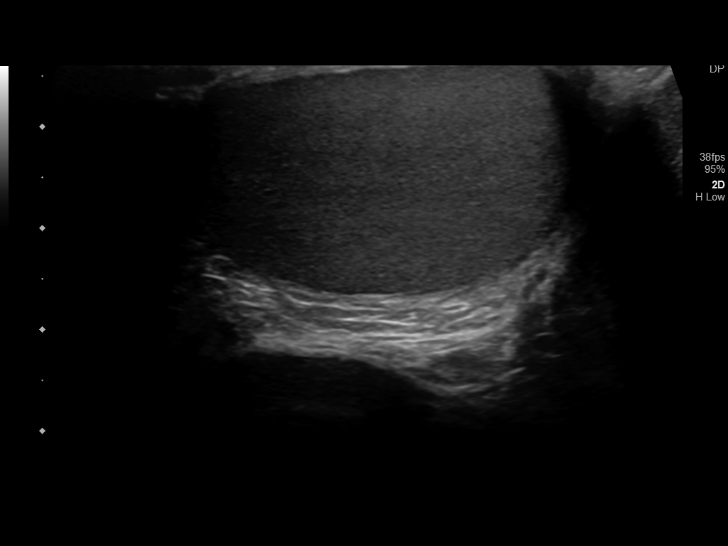
[im 20/116]
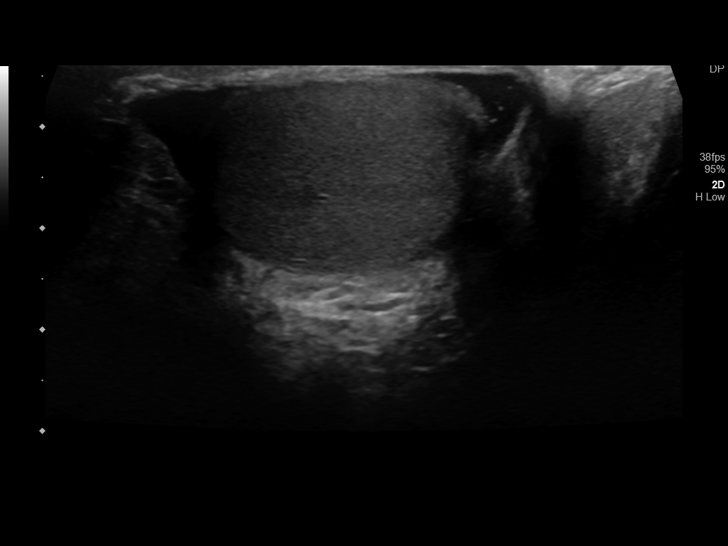
[im 29/116]
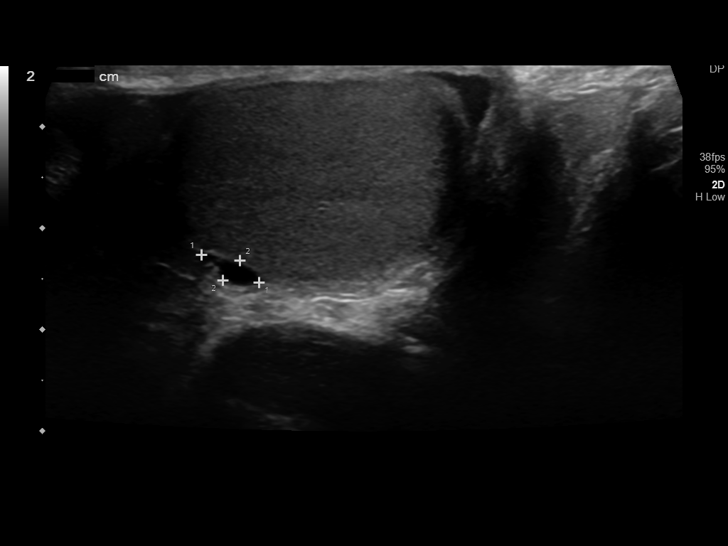
[im 39/116]
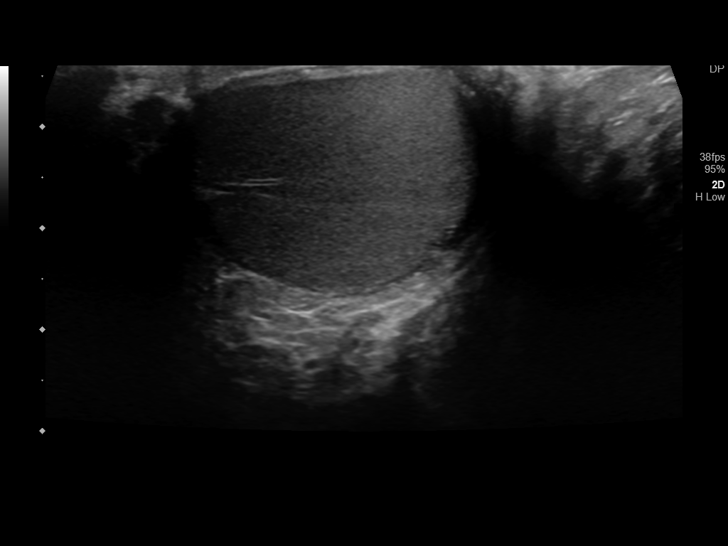
[im 44/116]
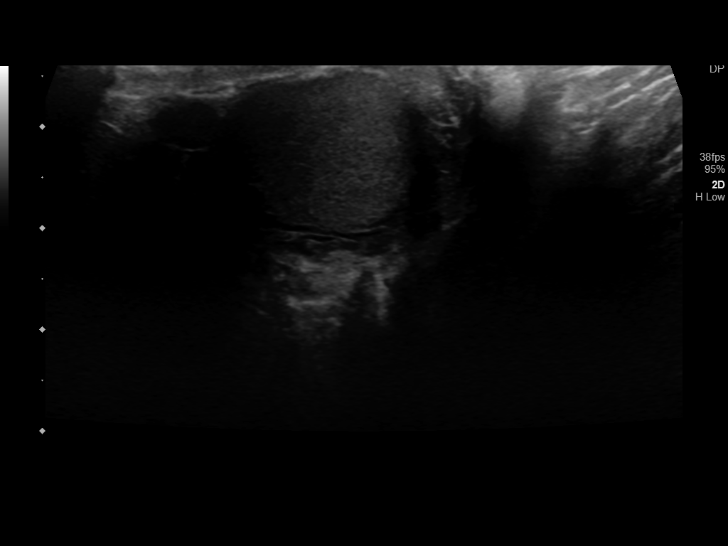
[im 53/116]
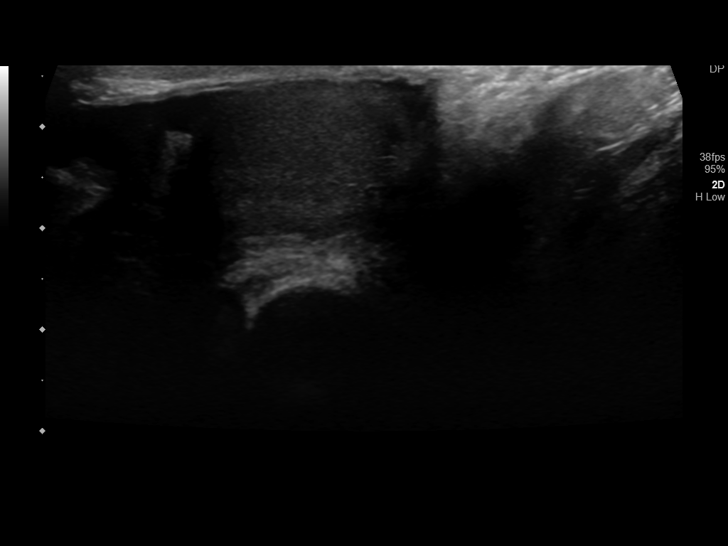
[im 63/116]
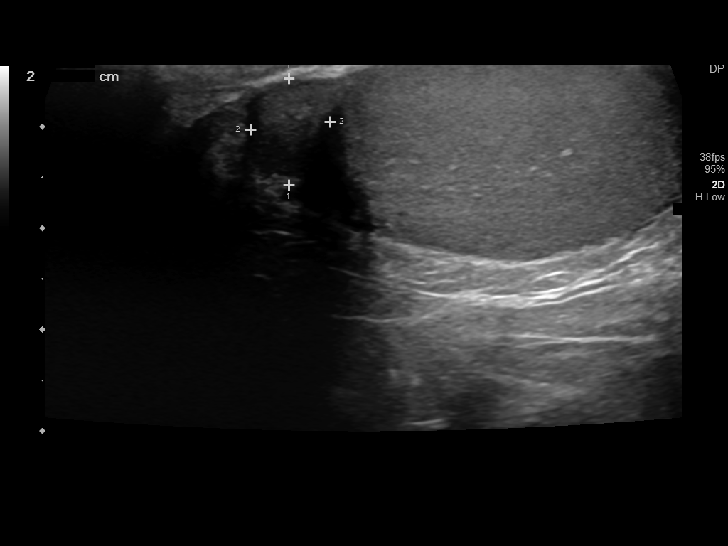
[im 72/116]
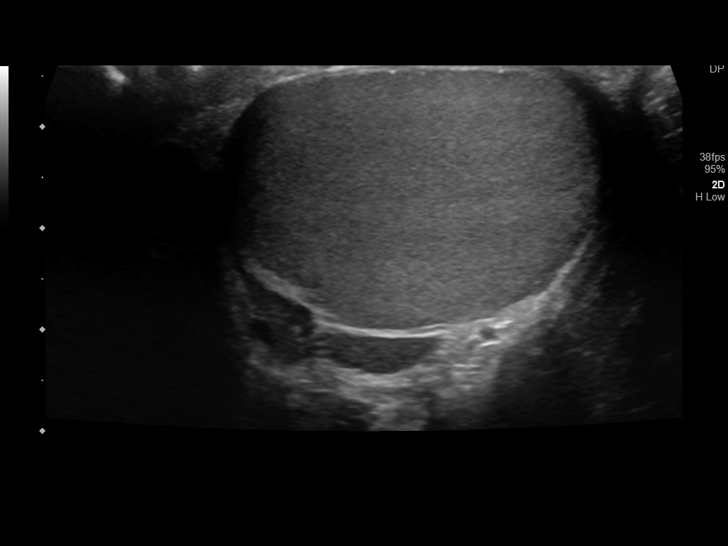
[im 77/116]
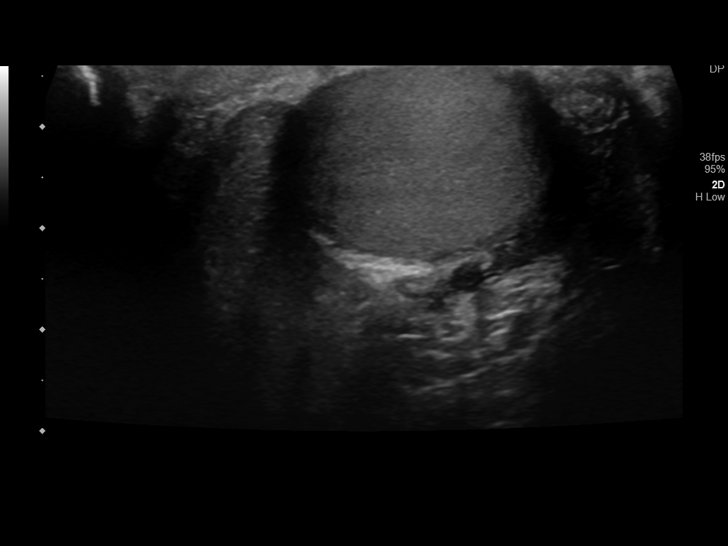
[im 87/116]
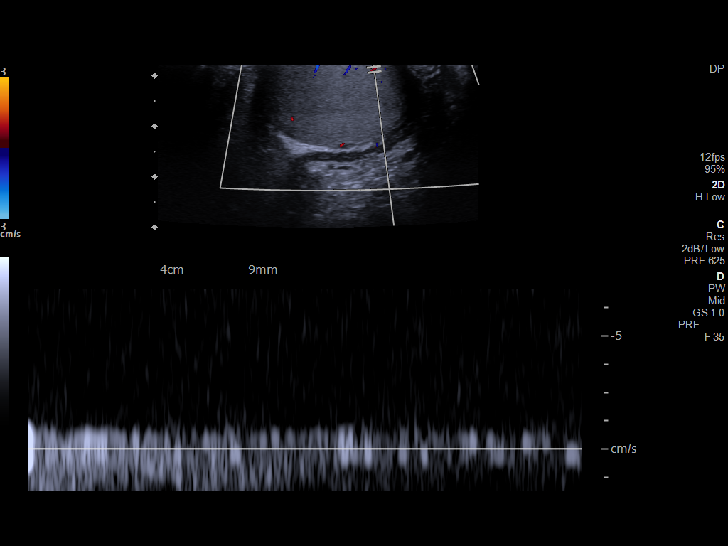
[im 96/116]
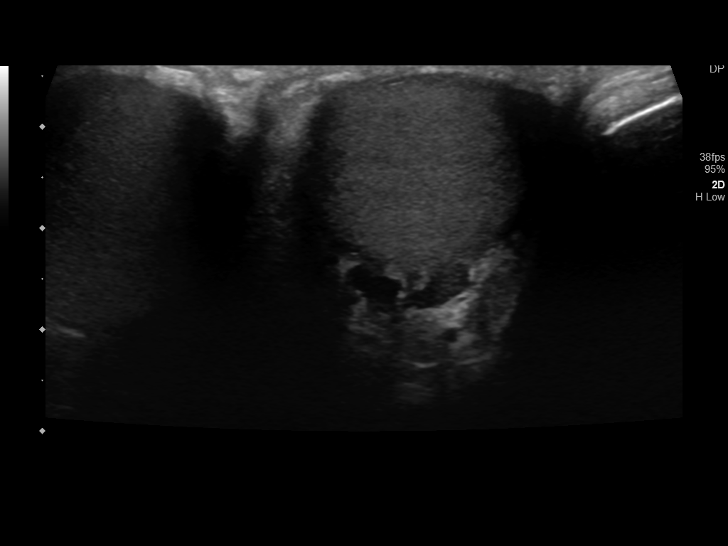
[im 106/116]
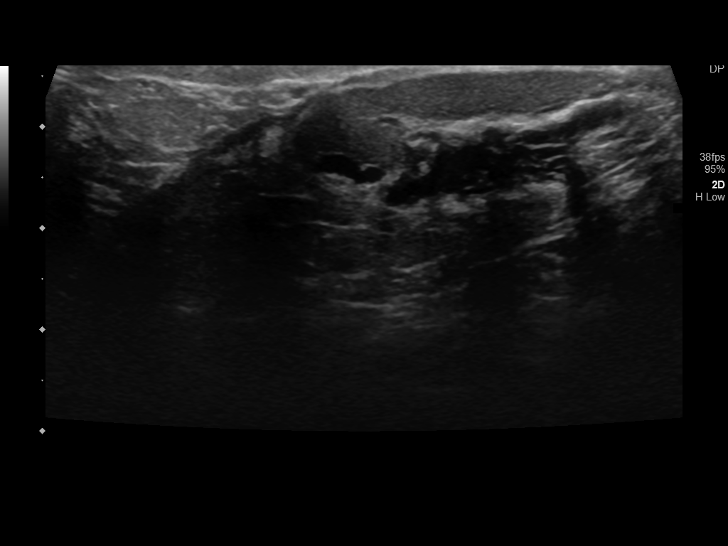
[im 116/116]
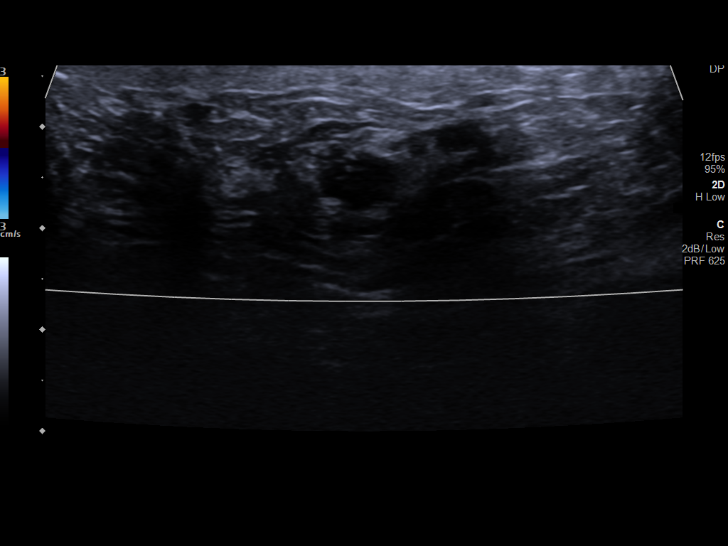

[14 of 25 positions shown; findings below may reference images not displayed]

FINDINGS: Right testicle

Measurements: 3.8 x 2.2 x 3.2 cm. No mass or microlithiasis
visualized. There is a small simple appearing cyst measuring about 6
mm maximal diameter either in the periphery or adjacent to the
superior right testis.

Left testicle

Measurements: 3.7 x 2.6 x 3.2 cm. No mass or microlithiasis
visualized.

Right epididymis:  Normal in size and appearance.

Left epididymis:  Normal in size and appearance.

Hydrocele:  None visualized.

Varicocele:  None visualized.

Pulsed Doppler interrogation of both testes demonstrates normal low
resistance arterial and venous waveforms bilaterally.
IMPRESSION: 6 mm peritesticular cyst on the right is likely benign. Left testis
is normal. No evidence of solid mass, torsion, or inflammatory
change.
